# Patient Record
Sex: Male | Born: 1955
Health system: Southern US, Community
[De-identification: ages and names within clinical notes are randomized; demographics above are authoritative.]

## PROBLEM LIST (undated history)

## (undated) DIAGNOSIS — I519 Heart disease, unspecified: Secondary | ICD-10-CM

## (undated) DIAGNOSIS — E785 Hyperlipidemia, unspecified: Secondary | ICD-10-CM

## (undated) DIAGNOSIS — I10 Essential (primary) hypertension: Secondary | ICD-10-CM

## (undated) HISTORY — PX: OTHER SURGICAL HISTORY: SHX169

## (undated) HISTORY — DX: Heart disease, unspecified: I51.9

## (undated) HISTORY — PX: COLONOSCOPY: SHX174

## (undated) HISTORY — DX: Essential (primary) hypertension: I10

## (undated) HISTORY — PX: NO PAST SURGERIES: SHX2092

---
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
## 2008-04-19 ENCOUNTER — Emergency Department (HOSPITAL_BASED_OUTPATIENT_CLINIC_OR_DEPARTMENT_OTHER): Admission: EM | Admit: 2008-04-19 | Discharge: 2008-04-19 | Payer: Self-pay | Admitting: Emergency Medicine

## 2011-03-30 LAB — POCT CARDIAC MARKERS
CKMB, poc: <1 — ABNORMAL LOW
Myoglobin, poc: 63.2
Troponin i, poc: <0.05

## 2011-03-30 LAB — COMPREHENSIVE METABOLIC PANEL
ALT: 19
AST: 22
Albumin: 4.6
Alkaline Phosphatase: 50
BUN: 15
CO2: 27
Calcium: 9.4
Chloride: 101
Creatinine, Ser: 1.1
GFR calc Af Amer: >60
GFR calc non Af Amer: >60
Glucose, Bld: 94
Potassium: 4.1
Sodium: 139
Total Bilirubin: 2 — ABNORMAL HIGH
Total Protein: 7.8

## 2011-03-30 LAB — URINALYSIS, ROUTINE W REFLEX MICROSCOPIC
Bilirubin Urine: NEGATIVE
Glucose, UA: NEGATIVE
Hgb urine dipstick: NEGATIVE
Ketones, ur: NEGATIVE
Nitrite: NEGATIVE
Protein, ur: NEGATIVE
Specific Gravity, Urine: 1.023
Urobilinogen, UA: 1
pH: 6.5

## 2011-03-30 LAB — DIFFERENTIAL
Basophils Absolute: 0.1
Basophils Relative: 1
Eosinophils Absolute: 0.1
Eosinophils Relative: 1
Lymphocytes Relative: 12
Lymphs Abs: 1.5
Monocytes Absolute: 1.6 — ABNORMAL HIGH
Monocytes Relative: 13 — ABNORMAL HIGH
Neutro Abs: 9.2 — ABNORMAL HIGH
Neutrophils Relative %: 74

## 2011-03-30 LAB — CBC
HCT: 48.7
Hemoglobin: 16.7
MCHC: 34.3
MCV: 92.9
Platelets: 202
RBC: 5.24
RDW: 11.5
WBC: 12.5 — ABNORMAL HIGH

## 2011-03-30 LAB — LIPASE, BLOOD: Lipase: 46

## 2017-06-09 ENCOUNTER — Telehealth: Payer: Self-pay | Admitting: *Deleted

## 2017-06-09 NOTE — Telephone Encounter (Signed)
Logan Fox-- who will mail this to pt?  Copied from [place] #[id number]. Topic: Appointment Scheduling - New Patient >> Jun 09, 2017  3:56 PM Antonieta Iba C wrote: New patient has been scheduled for your office.  Provider: Riki Sheer   Date of Appointment: 06/30/17  Route to department's PEC pool.

## 2017-06-10 NOTE — Telephone Encounter (Signed)
Will patient get at the front at check in? Or mail it.?

## 2017-06-30 ENCOUNTER — Ambulatory Visit (INDEPENDENT_AMBULATORY_CARE_PROVIDER_SITE_OTHER): Payer: BLUE CROSS/BLUE SHIELD | Admitting: Family Medicine

## 2017-06-30 ENCOUNTER — Encounter: Payer: Self-pay | Admitting: Family Medicine

## 2017-06-30 VITALS — BP 146/90 | HR 61 | Temp 97.8°F | Ht 74.0 in | Wt 222.2 lb

## 2017-06-30 DIAGNOSIS — I10 Essential (primary) hypertension: Secondary | ICD-10-CM | POA: Diagnosis not present

## 2017-06-30 DIAGNOSIS — I25118 Atherosclerotic heart disease of native coronary artery with other forms of angina pectoris: Secondary | ICD-10-CM | POA: Insufficient documentation

## 2017-06-30 DIAGNOSIS — I2510 Atherosclerotic heart disease of native coronary artery without angina pectoris: Secondary | ICD-10-CM

## 2017-06-30 NOTE — Progress Notes (Signed)
Chief Complaint  Patient presents with  . Establish Care       New Patient Visit SUBJECTIVE: HPI: Logan Fox is an 62 y.o.male who is being seen for establishing care.  The patient was previously seen at [facility].   Hypertension Patient presents for hypertension follow up. He does monitor home blood pressures. Blood pressures ranging on average from 130s/70-80's. He is compliant with medications. Patient has these side effects of medication: none He is adhering to a healthy diet overall. Exercise: cycling, lifting weights  12/2015 had 2 stents placed. On dual antiplatelets.   Knee OA, has been using CBD oil and it is helpful. Has not needed to take Aleve since then.   No Known Allergies  Past Medical History:  Diagnosis Date  . Heart disease   . Hypertension    Past Surgical History:  Procedure Laterality Date  . NO PAST SURGERIES     Social History   Socioeconomic History  . Marital status: Married  Tobacco Use  . Smoking status: Former Research scientist (life sciences)  . Smokeless tobacco: Never Used  Substance and Sexual Activity  . Alcohol use: Yes  . Drug use: No   Family History  Problem Relation Age of Onset  . Arthritis Mother   . Cancer Mother   . Diabetes Mother   . Heart disease Mother   . Hypertension Mother   . Alcohol abuse Father   . Arthritis Father   . Diabetes Father   . Heart disease Father   . Hypertension Father   . Cancer Sister   . Diabetes Sister   . Hypertension Sister   . Kidney disease Sister   . Diabetes Brother   . Hyperlipidemia Brother   . Hypertension Brother     Current Outpatient Medications:  .  aspirin EC 81 MG tablet, Take 81 mg by mouth daily., Disp: , Rfl:  .  carvedilol (COREG) 3.125 MG tablet, Take 3.125 mg by mouth 2 (two) times daily with a meal., Disp: , Rfl:  .  clopidogrel (PLAVIX) 75 MG tablet, Take 75 mg by mouth daily., Disp: , Rfl:  .  lisinopril (PRINIVIL,ZESTRIL) 20 MG tablet, Take 20 mg by mouth daily.,  Disp: , Rfl:   ROS Cardiovascular: Denies chest pain  Respiratory: Denies dyspnea   OBJECTIVE: BP (!) 146/90 (BP Location: Left Arm, Patient Position: Sitting, Cuff Size: Large)   Pulse 61   Temp 97.8 F (36.6 C) (Oral)   Ht 6\' 2"  (1.88 m)   Wt 222 lb 4 oz (100.8 kg)   SpO2 97%   BMI 28.54 kg/m   Constitutional: -  VS reviewed -  Well developed, well nourished, appears stated age -  No apparent distress  Psychiatric: -  Oriented to person, place, and time -  Memory intact -  Affect and mood normal -  Fluent conversation, good eye contact -  Judgment and insight age appropriate  Eye: -  Conjunctivae clear, no discharge -  Pupils symmetric, round, reactive to light  ENMT: -  MMM    Pharynx moist, no exudate, no erythema  Neck: -  No gross swelling, no palpable masses -  Thyroid midline, not enlarged, mobile, no palpable masses  Cardiovascular: -  RRR -  No LE edema  Respiratory: -  Normal respiratory effort, no accessory muscle use, no retraction -  Breath sounds equal, no wheezes, no ronchi, no crackles  Musculoskeletal: -  No clubbing, no cyanosis -  Gait normal  Skin: -  No significant lesion on inspection -  Warm and dry to palpation   ASSESSMENT/PLAN: Essential hypertension  Coronary artery disease involving native heart without angina pectoris, unspecified vessel or lesion type  Patient instructed to sign release of records form from his previous PCP. He has white coat syndrome, not interested in changing medicine at this time given his home readings.  He is going to try to go back on pravastatin with less freq dosing.  Patient should return 6 mo or prn. The patient voiced understanding and agreement to the plan.  [place], DO 06/30/17  8:13 AM

## 2017-06-30 NOTE — Progress Notes (Signed)
Pre visit review using our clinic review tool, if applicable. No additional management support is needed unless otherwise documented below in the visit note. 

## 2017-06-30 NOTE — Patient Instructions (Addendum)
OK to try every 3-4 day dosing of pravastatin. Stay well hydrated and try CoQ10 with it.    Keep checking your BP at home and if it starts to rise, let us know.   Let us know if you need anything.

## 2017-07-27 ENCOUNTER — Other Ambulatory Visit: Payer: Self-pay | Admitting: Family Medicine

## 2017-07-27 MED ORDER — CARVEDILOL 3.125 MG PO TABS: 3.1250 mg | ORAL_TABLET | Freq: Two times a day (BID) | ORAL | 4 refills | Status: DC

## 2017-09-22 ENCOUNTER — Other Ambulatory Visit: Payer: Self-pay | Admitting: Family Medicine

## 2017-09-22 MED ORDER — LISINOPRIL 20 MG PO TABS: 20.0000 mg | ORAL_TABLET | Freq: Every day | ORAL | 1 refills | Status: DC

## 2017-11-28 ENCOUNTER — Telehealth: Payer: Self-pay | Admitting: Family Medicine

## 2017-11-28 MED ORDER — PRAVASTATIN SODIUM 20 MG PO TABS: 20.0000 mg | ORAL_TABLET | Freq: Every day | ORAL | 3 refills | Status: DC

## 2017-11-28 NOTE — Telephone Encounter (Signed)
Reordered. TY.

## 2017-11-28 NOTE — Telephone Encounter (Signed)
Copied from [place] [id number]. Topic: Quick Communication - See Telephone Encounter >> Nov 28, 2017 12:51 PM Vernona Rieger wrote: CRM for notification. See Telephone encounter for: 11/28/17.  Patient said that the pharmacy has contacted Dr Nani Ravens in regards to his " proavastain 20 mg ". It is not on his current medication list and he spoke with Dr Nani Ravens about getting back on it at his appointment with him in January. Please advise  Insurance claims handler [address] [place], [place] - [address]

## 2017-12-17 ENCOUNTER — Other Ambulatory Visit: Payer: Self-pay | Admitting: Family Medicine

## 2017-12-28 ENCOUNTER — Ambulatory Visit (INDEPENDENT_AMBULATORY_CARE_PROVIDER_SITE_OTHER): Payer: BLUE CROSS/BLUE SHIELD | Admitting: Family Medicine

## 2017-12-28 ENCOUNTER — Encounter: Payer: Self-pay | Admitting: Family Medicine

## 2017-12-28 VITALS — BP 122/80 | HR 54 | Temp 98.1°F | Ht 74.0 in | Wt 226.0 lb

## 2017-12-28 DIAGNOSIS — Z114 Encounter for screening for human immunodeficiency virus [HIV]: Secondary | ICD-10-CM

## 2017-12-28 DIAGNOSIS — Z1159 Encounter for screening for other viral diseases: Secondary | ICD-10-CM | POA: Diagnosis not present

## 2017-12-28 DIAGNOSIS — I2510 Atherosclerotic heart disease of native coronary artery without angina pectoris: Secondary | ICD-10-CM | POA: Diagnosis not present

## 2017-12-28 DIAGNOSIS — I10 Essential (primary) hypertension: Secondary | ICD-10-CM | POA: Diagnosis not present

## 2017-12-28 LAB — LIPID PANEL
Cholesterol: 163 mg/dL (ref 0–200)
HDL: 47.9 mg/dL (ref >39.00)
LDL Cholesterol: 98 mg/dL (ref 0–99)
NonHDL: 114.71
Total CHOL/HDL Ratio: 3
Triglycerides: 83 mg/dL (ref 0.0–149.0)
VLDL: 16.6 mg/dL (ref 0.0–40.0)

## 2017-12-28 LAB — COMPREHENSIVE METABOLIC PANEL
ALT: 21 U/L (ref 0–53)
AST: 16 U/L (ref 0–37)
Albumin: 4.5 g/dL (ref 3.5–5.2)
Alkaline Phosphatase: 54 U/L (ref 39–117)
BUN: 17 mg/dL (ref 6–23)
CO2: 25 mEq/L (ref 19–32)
Calcium: 9.3 mg/dL (ref 8.4–10.5)
Chloride: 105 mEq/L (ref 96–112)
Creatinine, Ser: 0.95 mg/dL (ref 0.40–1.50)
GFR: 85.24 mL/min (ref >60.00)
Glucose, Bld: 108 mg/dL — ABNORMAL HIGH (ref 70–99)
Potassium: 4.4 mEq/L (ref 3.5–5.1)
Sodium: 138 mEq/L (ref 135–145)
Total Bilirubin: 0.8 mg/dL (ref 0.2–1.2)
Total Protein: 6.7 g/dL (ref 6.0–8.3)

## 2017-12-28 NOTE — Progress Notes (Signed)
Pre visit review using our clinic review tool, if applicable. No additional management support is needed unless otherwise documented below in the visit note. 

## 2017-12-28 NOTE — Progress Notes (Signed)
Chief Complaint  Patient presents with  . Follow-up    Subjective Logan Fox is a 62 y.o. male who presents for hypertension follow up. He does monitor home blood pressures. Blood pressures ranging from 120's/70-80's on average. He is compliant with medications. Patient has these side effects of medication: none He is adhering to a healthy diet overall. Current exercise: cycling, walking, elliptical  Hx of CAD Started on Pravachol and CoQ10 as he has had joint/muscle issues w Zocor, Lipitor and Zocor. No myalgias. Is active, diet healthy overall.    Past Medical History:  Diagnosis Date  . Heart disease   . Hypertension     Review of Systems Cardiovascular: no chest pain Respiratory:  no shortness of breath  Exam BP 122/80 (BP Location: Left Arm, Patient Position: Sitting, Cuff Size: Normal)   Pulse (!) 54   Temp 98.1 F (36.7 C) (Oral)   Ht 6\' 2"  (1.88 m)   Wt 226 lb (102.5 kg)   SpO2 93%   BMI 29.02 kg/m  General:  well developed, well nourished, in no apparent distress Heart: RRR, no bruits, no LE edema Lungs: clear to auscultation, no accessory muscle use Psych: well oriented with normal range of affect and appropriate judgment/insight  Essential hypertension - Plan: Comprehensive metabolic panel  Coronary artery disease involving native heart without angina pectoris, unspecified vessel or lesion type - Plan: Lipid panel  Screening for HIV (human immunodeficiency virus) - Plan: HIV antibody  Encounter for hepatitis C screening test for low risk patient - Plan: Hepatitis C antibody  Orders as above. Counseled on diet and exercise. F/u in 6 mo for CPE. The patient voiced understanding and agreement to the plan.  [place], DO 12/28/17  8:36 AM

## 2017-12-28 NOTE — Patient Instructions (Addendum)
Keep up the good work.   Ok to check your blood pressure whenever you want.   Keep the diet clean. Stay physically active.  Let us know if you need anything.

## 2017-12-29 LAB — HIV ANTIBODY (ROUTINE TESTING W REFLEX): HIV 1&2 Ab, 4th Generation: NONREACTIVE

## 2017-12-29 LAB — HEPATITIS C ANTIBODY
Hepatitis C Ab: NONREACTIVE
SIGNAL TO CUT-OFF: 0.02 (ref <1.00)

## 2018-03-13 ENCOUNTER — Telehealth: Payer: Self-pay

## 2018-03-13 ENCOUNTER — Ambulatory Visit (HOSPITAL_BASED_OUTPATIENT_CLINIC_OR_DEPARTMENT_OTHER)
Admission: RE | Admit: 2018-03-13 | Discharge: 2018-03-13 | Disposition: A | Discharge status: Home / Self Care | Payer: BLUE CROSS/BLUE SHIELD | Source: Ambulatory Visit | Attending: Family | Admitting: Family

## 2018-03-13 ENCOUNTER — Encounter: Payer: Self-pay | Admitting: Family

## 2018-03-13 ENCOUNTER — Ambulatory Visit (INDEPENDENT_AMBULATORY_CARE_PROVIDER_SITE_OTHER): Payer: BLUE CROSS/BLUE SHIELD | Admitting: Family

## 2018-03-13 VITALS — BP 130/84 | HR 56 | Temp 98.3°F | Resp 16 | Ht 74.0 in | Wt 225.6 lb

## 2018-03-13 DIAGNOSIS — M79674 Pain in right toe(s): Secondary | ICD-10-CM

## 2018-03-13 DIAGNOSIS — M25561 Pain in right knee: Secondary | ICD-10-CM

## 2018-03-13 DIAGNOSIS — M85671 Other cyst of bone, right ankle and foot: Secondary | ICD-10-CM | POA: Diagnosis not present

## 2018-03-13 MED ORDER — COLCHICINE 0.6 MG PO TABS: ORAL_TABLET | ORAL | 0 refills | Status: DC

## 2018-03-13 MED ORDER — COLCHICINE 0.6 MG PO CAPS: ORAL_CAPSULE | ORAL | 1 refills | Status: DC

## 2018-03-13 NOTE — Progress Notes (Signed)
Subjective:    Patient ID: Logan Fox, male    DOB: Apr 02, 1956, 62 y.o.   MRN: [id number]  HPI  Patient is a 62 yr old male who presents today with chief complaint of pain in the right great toe.  He reports the pain is been present for approximately 1 week.  He has previous history of gout.  He reports that he hyperextended his right great toe 1 week ago.  He has had pain ever since.  He does feel however that his symptoms are similar to his previous gout flares.  He also reports that he has some chronic right-sided knee pain.  He reports minimal improvement with up with over-the-counter Aleve.  He tries to limit his Aleve due to history of cardiovascular disease.  Review of Systems See HPI  Past Medical History:  Diagnosis Date  . Heart disease   . Hypertension      Social History   Socioeconomic History  . Marital status: Married    Spouse name: Not on file  . Number of children: Not on file  . Years of education: Not on file  . Highest education level: Not on file  Occupational History  . Not on file  Social Needs  . Financial resource strain: Not on file  . Food insecurity:    Worry: Not on file    Inability: Not on file  . Transportation needs:    Medical: Not on file    Non-medical: Not on file  Tobacco Use  . Smoking status: Former Smoker    Start date: 1973    Last attempt to quit: 1989    Years since quitting: 30.7  . Smokeless tobacco: Never Used  Substance and Sexual Activity  . Alcohol use: Yes  . Drug use: No  . Sexual activity: Yes    Partners: Female  Lifestyle  . Physical activity:    Days per week: Not on file    Minutes per session: Not on file  . Stress: Not on file  Relationships  . Social connections:    Talks on phone: Not on file    Gets together: Not on file    Attends religious service: Not on file    Active member of club or organization: Not on file    Attends meetings of clubs or organizations: Not on file    Relationship  status: Not on file  . Intimate partner violence:    Fear of current or ex partner: Not on file    Emotionally abused: Not on file    Physically abused: Not on file    Forced sexual activity: Not on file  Other Topics Concern  . Not on file  Social History Narrative  . Not on file    Past Surgical History:  Procedure Laterality Date  . NO PAST SURGERIES      Family History  Problem Relation Age of Onset  . Arthritis Mother   . Cancer Mother   . Diabetes Mother   . Heart disease Mother   . Hypertension Mother   . Alcohol abuse Father   . Arthritis Father   . Diabetes Father   . Heart disease Father   . Hypertension Father   . Cancer Sister   . Diabetes Sister   . Hypertension Sister   . Kidney disease Sister   . Diabetes Brother   . Hyperlipidemia Brother   . Hypertension Brother     No Known Allergies  Current Outpatient  Medications on File Prior to Visit  Medication Sig Dispense Refill  . aspirin EC 81 MG tablet Take 81 mg by mouth daily.    . carvedilol (COREG) 3.125 MG tablet TAKE 1 TABLET BY MOUTH TWICE DAILY WITH MEALS 60 tablet 4  . clopidogrel (PLAVIX) 75 MG tablet Take 75 mg by mouth daily.    Marland Kitchen lisinopril (PRINIVIL,ZESTRIL) 20 MG tablet Take 1 tablet (20 mg total) by mouth daily. 90 tablet 1  . pravastatin (PRAVACHOL) 20 MG tablet Take 1 tablet (20 mg total) by mouth daily. 90 tablet 3   No current facility-administered medications on file prior to visit.     BP 130/84 (BP Location: Left Arm, Cuff Size: Normal)   Pulse (!) 56   Temp 98.3 F (36.8 C) (Oral)   Resp 16   Ht 6\' 2"  (1.88 m)   Wt 225 lb 9.6 oz (102.3 kg)   SpO2 97%   BMI 28.97 kg/m       Objective:   Physical Exam  Constitutional: He is oriented to person, place, and time. He appears well-developed and well-nourished. No distress.  HENT:  Head: Normocephalic and atraumatic.  Cardiovascular: Normal rate and regular rhythm.  No murmur heard. Pulmonary/Chest: Effort normal and  breath sounds normal. No respiratory distress. He has no wheezes. He has no rales.  Musculoskeletal:  Mild swelling of the right knee  Mild erythema at the base of the right great toe  Neurological: He is alert and oriented to person, place, and time.  Skin: Skin is warm and dry.  Psychiatric: He has a normal mood and affect. His behavior is normal. Thought content normal.          Assessment & Plan:  Gout-prescription provided for colchicine.  He requests a refill which we will call into his pharmacy.  We discussed possibility of adding a preventive medication for gout on a daily basis however he declines since he reports that he typically only has flareups about twice a year.  Since he did have some trauma to the base of that toe about a week ago and has been having pain since then we will obtain an x-ray of the great toe.  Right knee pain- will refer to sports medicine for further evaluation.

## 2018-03-13 NOTE — Telephone Encounter (Signed)
Generic colchicine not covered, preferred brand name Mitigare.

## 2018-03-13 NOTE — Patient Instructions (Signed)
Start colchicine. Call if symptoms worsen or if they do not improve. Complete toe x-ray on the first floor. You should be contacted about scheduling your referral to sports medicine.

## 2018-03-13 NOTE — Telephone Encounter (Signed)
Completed. Refill added to rx as well at pt request. Gilmore Laroche, please disregard my earlier staff message.

## 2018-03-19 ENCOUNTER — Other Ambulatory Visit: Payer: Self-pay | Admitting: Family Medicine

## 2018-03-19 DIAGNOSIS — I10 Essential (primary) hypertension: Secondary | ICD-10-CM

## 2018-03-21 ENCOUNTER — Encounter: Payer: Self-pay | Admitting: Family Medicine

## 2018-03-21 ENCOUNTER — Ambulatory Visit (INDEPENDENT_AMBULATORY_CARE_PROVIDER_SITE_OTHER): Payer: BLUE CROSS/BLUE SHIELD | Admitting: Family Medicine

## 2018-03-21 VITALS — BP 160/92 | HR 63 | Ht 74.0 in | Wt 225.0 lb

## 2018-03-21 DIAGNOSIS — M25561 Pain in right knee: Secondary | ICD-10-CM

## 2018-03-21 DIAGNOSIS — G8929 Other chronic pain: Secondary | ICD-10-CM

## 2018-03-21 NOTE — Progress Notes (Signed)
PCP: Shelda Pal, DO Consultation requested by Debbrah Alar NP  Subjective:   HPI: Patient is a 62 y.o. male here for right knee pain.  Patient reports he's had about 8-10 months of off and on pain in right knee. Pain is achy and a stiffness. Thinks he aggravated this remotely when he stopped quickly on his bike and turned right knee some. Feels like he gets cramps into anterior, posterior leg. Pain with bending. Feels pretty good today though. Some associated swelling. No skin changes, numbness.  Past Medical History:  Diagnosis Date  . Heart disease   . Hypertension     Current Outpatient Medications on File Prior to Visit  Medication Sig Dispense Refill  . aspirin EC 81 MG tablet Take 81 mg by mouth daily.    . carvedilol (COREG) 3.125 MG tablet TAKE 1 TABLET BY MOUTH TWICE DAILY WITH MEALS 60 tablet 4  . clopidogrel (PLAVIX) 75 MG tablet Take 75 mg by mouth daily.    . Colchicine (MITIGARE) 0.6 MG CAPS 2 caps now, 1 cap 1 hour later.  If symptoms persist, you may repeat the following day. 6 capsule 1  . lisinopril (PRINIVIL,ZESTRIL) 20 MG tablet TAKE 1 TABLET BY MOUTH ONCE DAILY 90 tablet 1  . pravastatin (PRAVACHOL) 20 MG tablet Take 1 tablet (20 mg total) by mouth daily. 90 tablet 3   No current facility-administered medications on file prior to visit.     Past Surgical History:  Procedure Laterality Date  . NO PAST SURGERIES      No Known Allergies  Social History   Socioeconomic History  . Marital status: Married    Spouse name: Not on file  . Number of children: Not on file  . Years of education: Not on file  . Highest education level: Not on file  Occupational History  . Not on file  Social Needs  . Financial resource strain: Not on file  . Food insecurity:    Worry: Not on file    Inability: Not on file  . Transportation needs:    Medical: Not on file    Non-medical: Not on file  Tobacco Use  . Smoking status: Former Smoker   Start date: 1973    Last attempt to quit: 1989    Years since quitting: 30.7  . Smokeless tobacco: Never Used  Substance and Sexual Activity  . Alcohol use: Yes  . Drug use: No  . Sexual activity: Yes    Partners: Female  Lifestyle  . Physical activity:    Days per week: Not on file    Minutes per session: Not on file  . Stress: Not on file  Relationships  . Social connections:    Talks on phone: Not on file    Gets together: Not on file    Attends religious service: Not on file    Active member of club or organization: Not on file    Attends meetings of clubs or organizations: Not on file    Relationship status: Not on file  . Intimate partner violence:    Fear of current or ex partner: Not on file    Emotionally abused: Not on file    Physically abused: Not on file    Forced sexual activity: Not on file  Other Topics Concern  . Not on file  Social History Narrative  . Not on file    Family History  Problem Relation Age of Onset  . Arthritis Mother   .  Cancer Mother   . Diabetes Mother   . Heart disease Mother   . Hypertension Mother   . Alcohol abuse Father   . Arthritis Father   . Diabetes Father   . Heart disease Father   . Hypertension Father   . Cancer Sister   . Diabetes Sister   . Hypertension Sister   . Kidney disease Sister   . Diabetes Brother   . Hyperlipidemia Brother   . Hypertension Brother     BP (!) 160/92   Pulse 63   Ht 6\' 2"  (1.88 m)   Wt 225 lb (102.1 kg)   BMI 28.89 kg/m   Review of Systems: See HPI above.     Objective:  Physical Exam:  Gen: NAD, comfortable in exam room  Right knee: No gross deformity, ecchymoses.  Mild effusion. No TTP currently. FROM with 5/5 strength flexion and extension. Negative ant/post drawers. Negative valgus/varus testing. Negative lachmans. Negative mcmurrays, apleys, patellar apprehension, thessalys. NV intact distally.  Left knee: No deformity. FROM with 5/5 strength. No tenderness to  palpation. NVI distally.   MSK u/s:  Mild effusion right knee.  Minimal baker's cyst.  Mild arthropathy noted medial compartment.  Assessment & Plan:  1. Right knee pain - consistent with arthritis causing effusion.  Discussed tylenol, topical medications, supplements that may help, aleve.  Knee sleeve for support should help a lot as well.  Shown home exercises to do daily.  Icing, elevation.  F/u in 1 month or as needed.

## 2018-03-21 NOTE — Patient Instructions (Signed)
Your pain is due to arthritis causing swelling in your knee. These are the different medications you can take for this: Tylenol 500mg  1-2 tabs three times a day for pain. Capsaicin, aspercreme, or biofreeze topically up to four times a day may also help with pain. Some supplements that may help for arthritis: Boswellia extract, curcumin, pycnogenol Aleve 1-2 tabs twice a day with food as needed. Knee sleeve (bodyhelix) should help you a lot when up and walking around. Cortisone injections are an option. If cortisone injections do not help, there are different types of shots that may help but they take longer to take effect. It's important that you continue to stay active. Straight leg raises, knee extensions 3 sets of 10 once a day (add ankle weight if these become too easy). Consider physical therapy to strengthen muscles around the joint that hurts to take pressure off of the joint itself. Shoe inserts with good arch support may be helpful. Ice 15 minutes at a time 3-4 times a day as needed to help with pain. Water aerobics and cycling with low resistance are the best two types of exercise for arthritis though any exercise is ok as long as it doesn't worsen the pain. Follow up with me in 1 month or as needed.

## 2018-04-21 ENCOUNTER — Ambulatory Visit: Payer: BLUE CROSS/BLUE SHIELD | Admitting: Family Medicine

## 2018-05-05 ENCOUNTER — Telehealth: Payer: Self-pay | Admitting: Family Medicine

## 2018-05-05 DIAGNOSIS — I2510 Atherosclerotic heart disease of native coronary artery without angina pectoris: Secondary | ICD-10-CM

## 2018-05-05 NOTE — Telephone Encounter (Signed)
Copied from [place] [id number]. Topic: Referral - Request for Referral >> May 05, 2018  9:09 AM Sheran Luz wrote: Has patient seen PCP for this complaint? No *If NO, is insurance requiring patient see PCP for this issue before PCP can refer them? Patient does not know.   Referral for which specialty: Cardiology  Preferred provider/office: [facility]  Reason for referral: Had two stints put in three years ago-Now seeing [facility] Cardiologist and is unhappy.

## 2018-05-05 NOTE — Telephone Encounter (Signed)
OK 

## 2018-05-21 ENCOUNTER — Other Ambulatory Visit: Payer: Self-pay | Admitting: Family Medicine

## 2018-06-12 ENCOUNTER — Ambulatory Visit (INDEPENDENT_AMBULATORY_CARE_PROVIDER_SITE_OTHER): Payer: BLUE CROSS/BLUE SHIELD | Admitting: Family Medicine

## 2018-06-12 ENCOUNTER — Encounter: Payer: Self-pay | Admitting: Family Medicine

## 2018-06-12 ENCOUNTER — Ambulatory Visit (HOSPITAL_BASED_OUTPATIENT_CLINIC_OR_DEPARTMENT_OTHER)
Admission: RE | Admit: 2018-06-12 | Discharge: 2018-06-12 | Disposition: A | Discharge status: Home / Self Care | Payer: BLUE CROSS/BLUE SHIELD | Source: Ambulatory Visit | Attending: Family Medicine | Admitting: Family Medicine

## 2018-06-12 VITALS — BP 130/82 | HR 57 | Ht 74.0 in | Wt 227.0 lb

## 2018-06-12 DIAGNOSIS — M25561 Pain in right knee: Secondary | ICD-10-CM

## 2018-06-12 DIAGNOSIS — G8929 Other chronic pain: Secondary | ICD-10-CM | POA: Insufficient documentation

## 2018-06-12 NOTE — Patient Instructions (Signed)
We will go ahead with an MRI of your knee to assess for a loose body and/or a meniscus tear that's causing your block in extension and flexion. I will call you with the results and next steps. Aleve 1-2 tabs twice a day with food as needed. Knee sleeve (bodyhelix) should help you a lot when up and walking around.

## 2018-06-12 NOTE — Progress Notes (Signed)
PCP: Shelda Pal, DO Consultation requested by Debbrah Alar NP  Subjective:   HPI: Patient is a 62 y.o. male here for right knee pain.  9/24: Patient reports he's had about 8-10 months of off and on pain in right knee. Pain is achy and a stiffness. Thinks he aggravated this remotely when he stopped quickly on his bike and turned right knee some. Feels like he gets cramps into anterior, posterior leg. Pain with bending. Feels pretty good today though. Some associated swelling. No skin changes, numbness.  12/16: Patient reports improvement in his pain.  Currently 1/10 pain, a soreness posterior knee.  He does take Aleve about daily to help with his pain.  Otherwise, he is able to walk and even jog fairly comfortably.  His biggest complaint at this time is issues with achieving full extension or flexion the knee.  His pain is worse when trying to flex his knee.  He does note some mild persistent swelling but overall this has greatly improved.  No numbness or tingling.  No erythema or bruising.  No skin changes.  He wears a compression knee sleeve which he finds very helpful.  Past Medical History:  Diagnosis Date  . Heart disease   . Hypertension     Current Outpatient Medications on File Prior to Visit  Medication Sig Dispense Refill  . aspirin EC 81 MG tablet Take 81 mg by mouth daily.    . carvedilol (COREG) 3.125 MG tablet TAKE 1 TABLET BY MOUTH TWICE DAILY WITH MEALS 60 tablet 4  . clopidogrel (PLAVIX) 75 MG tablet Take 75 mg by mouth daily.    . Colchicine (MITIGARE) 0.6 MG CAPS 2 caps now, 1 cap 1 hour later.  If symptoms persist, you may repeat the following day. 6 capsule 1  . lisinopril (PRINIVIL,ZESTRIL) 20 MG tablet TAKE 1 TABLET BY MOUTH ONCE DAILY 90 tablet 1  . pravastatin (PRAVACHOL) 20 MG tablet Take 1 tablet (20 mg total) by mouth daily. 90 tablet 3   No current facility-administered medications on file prior to visit.     Past Surgical History:   Procedure Laterality Date  . NO PAST SURGERIES      No Known Allergies  Social History   Socioeconomic History  . Marital status: Married    Spouse name: Not on file  . Number of children: Not on file  . Years of education: Not on file  . Highest education level: Not on file  Occupational History  . Not on file  Social Needs  . Financial resource strain: Not on file  . Food insecurity:    Worry: Not on file    Inability: Not on file  . Transportation needs:    Medical: Not on file    Non-medical: Not on file  Tobacco Use  . Smoking status: Former Smoker    Start date: 1973    Last attempt to quit: 1989    Years since quitting: 30.9  . Smokeless tobacco: Never Used  Substance and Sexual Activity  . Alcohol use: Yes  . Drug use: No  . Sexual activity: Yes    Partners: Female  Lifestyle  . Physical activity:    Days per week: Not on file    Minutes per session: Not on file  . Stress: Not on file  Relationships  . Social connections:    Talks on phone: Not on file    Gets together: Not on file    Attends religious service: Not  on file    Active member of club or organization: Not on file    Attends meetings of clubs or organizations: Not on file    Relationship status: Not on file  . Intimate partner violence:    Fear of current or ex partner: Not on file    Emotionally abused: Not on file    Physically abused: Not on file    Forced sexual activity: Not on file  Other Topics Concern  . Not on file  Social History Narrative  . Not on file    Family History  Problem Relation Age of Onset  . Arthritis Mother   . Cancer Mother   . Diabetes Mother   . Heart disease Mother   . Hypertension Mother   . Alcohol abuse Father   . Arthritis Father   . Diabetes Father   . Heart disease Father   . Hypertension Father   . Cancer Sister   . Diabetes Sister   . Hypertension Sister   . Kidney disease Sister   . Diabetes Brother   . Hyperlipidemia Brother   .  Hypertension Brother     BP 130/82   Pulse (!) 57   Ht 6\' 2"  (1.88 m)   Wt 227 lb (103 kg)   BMI 29.15 kg/m   Review of Systems: See HPI above.     Objective:  Physical Exam:  GEN: awake, alert, NAD Pulm: breathing unlabored  Right knee: - Inspection: no gross deformity.  Mild swelling.  No erythema or bruising. Skin intact - Palpation: no TTP - ROM: Patient is limited to about 90 degrees of knee flexion both actively and passively.  Posterior knee pain with flexion.  Slight decreased extension compared to the left as well. - Strength: 5/5 strength - Neuro/vasc: NV intact - Special Tests: - LIGAMENTS: negative anterior and posterior drawer, negative Lachman's, no MCL or LCL laxity  -- MENISCUS: negative McMurray's -- PF JOINT: nml patellar mobility without apprehension  MSK [place]: limited US right knee - mild effusion. Slight extrusion of the medial meniscus. Calcific changes visualized within the posterior medial meniscus without obvious tearing. No bakers cyst.  Left knee: No swelling No tenderness Full range of motion of flexion-extension with 5/5 strength N/V intact  Normal hip range of motion with IR/ER without pain  Assessment & Plan:  1. Right knee pain-patient does have some mild arthritis, moderate in patellofemoral compartment.  Overall his pain is improved, however his range of motion limitations are concerning for possible meniscal pathology vs loose body.  - Continue Aleve as needed - MRI of the right knee - continue home strengthening exercises - Follow-up after MRI results

## 2018-07-02 ENCOUNTER — Ambulatory Visit
Admission: RE | Admit: 2018-07-02 | Discharge: 2018-07-02 | Disposition: A | Discharge status: Home / Self Care | Payer: BLUE CROSS/BLUE SHIELD | Source: Ambulatory Visit | Attending: Family Medicine | Admitting: Family Medicine

## 2018-07-02 ENCOUNTER — Other Ambulatory Visit: Payer: BLUE CROSS/BLUE SHIELD

## 2018-07-02 DIAGNOSIS — G8929 Other chronic pain: Secondary | ICD-10-CM

## 2018-07-02 DIAGNOSIS — M25561 Pain in right knee: Principal | ICD-10-CM

## 2018-07-04 NOTE — Addendum Note (Signed)
Addended by: Sherrie George F on: 07/04/2018 04:57 PM   Modules accepted: Orders

## 2018-07-12 ENCOUNTER — Telehealth: Payer: Self-pay | Admitting: *Deleted

## 2018-07-12 NOTE — Telephone Encounter (Signed)
Received Medical/Surgical Clearance Form from [facility]; forwarded to provider/SLS 01/15

## 2018-07-13 ENCOUNTER — Encounter: Payer: BLUE CROSS/BLUE SHIELD | Admitting: Family Medicine

## 2018-08-11 ENCOUNTER — Ambulatory Visit (INDEPENDENT_AMBULATORY_CARE_PROVIDER_SITE_OTHER): Payer: BLUE CROSS/BLUE SHIELD | Admitting: Family Medicine

## 2018-08-11 ENCOUNTER — Encounter: Payer: Self-pay | Admitting: Family Medicine

## 2018-08-11 VITALS — BP 134/84 | HR 60 | Temp 98.1°F | Ht 74.0 in | Wt 226.4 lb

## 2018-08-11 DIAGNOSIS — I2510 Atherosclerotic heart disease of native coronary artery without angina pectoris: Secondary | ICD-10-CM

## 2018-08-11 DIAGNOSIS — M1A9XX0 Chronic gout, unspecified, without tophus (tophi): Secondary | ICD-10-CM

## 2018-08-11 DIAGNOSIS — I10 Essential (primary) hypertension: Secondary | ICD-10-CM

## 2018-08-11 MED ORDER — COLCHICINE 0.6 MG PO CAPS: ORAL_CAPSULE | ORAL | 1 refills | Status: DC

## 2018-08-11 NOTE — Progress Notes (Signed)
Chief Complaint  Patient presents with  . Follow-up    Subjective Logan Fox is a 63 y.o. male who presents for hypertension follow up. He does monitor home blood pressures. Blood pressures ranging from 120-130's/70-80's on average. He is compliant with medications- lisinopril 20 mg, Coreg 3.125 mg bid. Patient has these side effects of medication: none He is usually adhering to a healthy diet overall. Current exercise: none currently due to recent scope  History of coronary artery disease.  He was doing well on the follow-up.  That being said, his insurance stopped covering this medicine so now he is on Crestor 5 mg 3 days a week.  He has not been very active due to her recent knee surgery and his diet is fair overall.  History of gout, no recent flares.  Takes colchicine for flares and would like a refill.   Past Medical History:  Diagnosis Date  . Heart disease   . Hypertension     Review of Systems Cardiovascular: no chest pain Respiratory:  no shortness of breath  Exam BP 134/84 (BP Location: Left Arm, Patient Position: Sitting)   Pulse 60   Temp 98.1 F (36.7 C) (Oral)   Ht 6\' 2"  (1.88 m)   Wt 226 lb 6 oz (102.7 kg)   SpO2 96%   BMI 29.06 kg/m  General:  well developed, well nourished, in no apparent distress Heart: RRR, no bruits, no LE edema Lungs: clear to auscultation, no accessory muscle use Psych: well oriented with normal range of affect and appropriate judgment/insight  Essential hypertension  Coronary artery disease involving native heart without angina pectoris, unspecified vessel or lesion type - Plan: rosuvastatin (CRESTOR) 5 MG tablet  Chronic gout without tophus, unspecified cause, unspecified site - Plan: Colchicine (MITIGARE) 0.6 MG CAPS  Cont medications. Counseled on diet and exercise. OK to stop checking home BP's if he wants to. F/u in 6 mo for CPE. The patient voiced understanding and agreement to the plan.  [place],  DO 08/11/18  8:55 AM

## 2018-08-11 NOTE — Patient Instructions (Addendum)
Keep the diet clean and stay active.  Because your blood pressure is well-controlled, you no longer have to check your blood pressure at home anymore unless you wish. Some people check it twice daily every day and some people stop altogether. Either or anything in between is fine. Strong work!  Let us know if you need anything. 

## 2018-09-17 ENCOUNTER — Other Ambulatory Visit: Payer: Self-pay | Admitting: Family Medicine

## 2018-09-17 DIAGNOSIS — I10 Essential (primary) hypertension: Secondary | ICD-10-CM

## 2018-10-23 ENCOUNTER — Other Ambulatory Visit: Payer: Self-pay | Admitting: Family Medicine

## 2018-11-07 ENCOUNTER — Telehealth: Payer: Self-pay | Admitting: Family Medicine

## 2018-11-07 DIAGNOSIS — I2510 Atherosclerotic heart disease of native coronary artery without angina pectoris: Secondary | ICD-10-CM

## 2018-11-07 MED ORDER — ROSUVASTATIN CALCIUM 5 MG PO TABS
5.0000 mg | ORAL_TABLET | Freq: Every day | ORAL | 3 refills | Status: DC
Start: 2018-11-07 — End: 2019-02-09

## 2018-11-07 NOTE — Telephone Encounter (Signed)
done

## 2018-11-07 NOTE — Telephone Encounter (Signed)
OK to do. Ty.   

## 2018-11-07 NOTE — Telephone Encounter (Signed)
Pharmacy fax patient wanting to go back to taking Crestor 5 mg daily (currently was taking three times per week) If ok let me know and can send in as patient requested.

## 2018-11-20 ENCOUNTER — Other Ambulatory Visit: Payer: Self-pay | Admitting: Family Medicine

## 2018-12-17 ENCOUNTER — Other Ambulatory Visit: Payer: Self-pay | Admitting: Family Medicine

## 2018-12-17 DIAGNOSIS — I10 Essential (primary) hypertension: Secondary | ICD-10-CM

## 2019-01-28 ENCOUNTER — Other Ambulatory Visit: Payer: Self-pay | Admitting: Family Medicine

## 2019-02-09 ENCOUNTER — Ambulatory Visit (INDEPENDENT_AMBULATORY_CARE_PROVIDER_SITE_OTHER): Payer: BC Managed Care – PPO | Admitting: Family Medicine

## 2019-02-09 ENCOUNTER — Other Ambulatory Visit: Payer: Self-pay

## 2019-02-09 ENCOUNTER — Encounter: Payer: Self-pay | Admitting: Family Medicine

## 2019-02-09 VITALS — BP 108/68 | HR 58 | Temp 98.3°F | Ht 75.0 in | Wt 224.0 lb

## 2019-02-09 DIAGNOSIS — Z0000 Encounter for general adult medical examination without abnormal findings: Secondary | ICD-10-CM

## 2019-02-09 DIAGNOSIS — I10 Essential (primary) hypertension: Secondary | ICD-10-CM | POA: Diagnosis not present

## 2019-02-09 DIAGNOSIS — I2510 Atherosclerotic heart disease of native coronary artery without angina pectoris: Secondary | ICD-10-CM

## 2019-02-09 LAB — LIPID PANEL
Cholesterol: 156 mg/dL (ref 0–200)
HDL: 42.8 mg/dL (ref >39.00)
LDL Cholesterol: 94 mg/dL (ref 0–99)
NonHDL: 113.6
Total CHOL/HDL Ratio: 4
Triglycerides: 96 mg/dL (ref 0.0–149.0)
VLDL: 19.2 mg/dL (ref 0.0–40.0)

## 2019-02-09 LAB — CBC
HCT: 45.9 % (ref 39.0–52.0)
Hemoglobin: 16.1 g/dL (ref 13.0–17.0)
MCHC: 35.2 g/dL (ref 30.0–36.0)
MCV: 95 fl (ref 78.0–100.0)
Platelets: 193 10*3/uL (ref 150.0–400.0)
RBC: 4.83 Mil/uL (ref 4.22–5.81)
RDW: 12.2 % (ref 11.5–15.5)
WBC: 5.8 10*3/uL (ref 4.0–10.5)

## 2019-02-09 LAB — COMPREHENSIVE METABOLIC PANEL
ALT: 23 U/L (ref 0–53)
AST: 18 U/L (ref 0–37)
Albumin: 4.6 g/dL (ref 3.5–5.2)
Alkaline Phosphatase: 54 U/L (ref 39–117)
BUN: 17 mg/dL (ref 6–23)
CO2: 24 mEq/L (ref 19–32)
Calcium: 9.4 mg/dL (ref 8.4–10.5)
Chloride: 103 mEq/L (ref 96–112)
Creatinine, Ser: 0.91 mg/dL (ref 0.40–1.50)
GFR: 83.98 mL/min (ref >60.00)
Glucose, Bld: 108 mg/dL — ABNORMAL HIGH (ref 70–99)
Potassium: 4.2 mEq/L (ref 3.5–5.1)
Sodium: 136 mEq/L (ref 135–145)
Total Bilirubin: 1.1 mg/dL (ref 0.2–1.2)
Total Protein: 6.7 g/dL (ref 6.0–8.3)

## 2019-02-09 MED ORDER — ROSUVASTATIN CALCIUM 5 MG PO TABS: 5.0000 mg | ORAL_TABLET | Freq: Every day | ORAL | 2 refills | Status: DC

## 2019-02-09 MED ORDER — LISINOPRIL 20 MG PO TABS: 20.0000 mg | ORAL_TABLET | Freq: Every day | ORAL | 2 refills | Status: DC

## 2019-02-09 NOTE — Patient Instructions (Addendum)
The new Shingrix vaccine (for shingles) is a 2 shot series. It can make people feel low energy, achy and almost like they have the flu for 48 hours after injection. Please plan accordingly when deciding on when to get this shot. Call our office for a nurse visit appointment to get this. The second shot of the series is less severe regarding the side effects, but it still lasts 48 hours.   Give [place] 2-3 business days to get the results of your labs back.   Keep the diet clean and stay active.   I suggest getting flu shots in mid October.   Let us know if you need anything.

## 2019-02-09 NOTE — Progress Notes (Signed)
Chief Complaint  Patient presents with  . Follow-up    [name] is here for a complete physical.   His last physical was >1 year ago.  Current diet: in general, diet OK overall.  Current exercise: elliptical  Weight trend: stable aytime fatigue? No. Seat belt? Yes.    Health maintenance Shingrix- No Colonoscopy- Yes Tetanus- Yes HIV- Yes Hep C- Yes   Past Medical History:  Diagnosis Date  . Heart disease   . Hypertension       Past Surgical History:  Procedure Laterality Date  . NO PAST SURGERIES      Medications  Current Outpatient Medications on File Prior to Visit  Medication Sig Dispense Refill  . aspirin EC 81 MG tablet Take 81 mg by mouth daily.    . carvedilol (COREG) 3.125 MG tablet TAKE 1 TABLET BY MOUTH TWICE DAILY WITH MEALS 60 tablet 0  . Colchicine (MITIGARE) 0.6 MG CAPS 2 caps now, 1 cap 1 hour later.  If symptoms persist, you may repeat the following day. 30 capsule 1  . lisinopril (ZESTRIL) 20 MG tablet Take 1 tablet by mouth once daily 90 tablet 0  . rosuvastatin (CRESTOR) 5 MG tablet Take 1 tablet (5 mg total) by mouth daily. 30 tablet 3   Allergies No Known Allergies  Family History Family History  Problem Relation Age of Onset  . Arthritis Mother   . Cancer Mother   . Diabetes Mother   . Heart disease Mother   . Hypertension Mother   . Alcohol abuse Father   . Arthritis Father   . Diabetes Father   . Heart disease Father   . Hypertension Father   . Cancer Sister   . Diabetes Sister   . Hypertension Sister   . Kidney disease Sister   . Diabetes Brother   . Hyperlipidemia Brother   . Hypertension Brother     Review of Systems: Constitutional:  no fevers Eye:  no recent significant change in vision Ear/Nose/Mouth/Throat:  Ears:  no hearing loss Nose/Mouth/Throat:  no complaints of nasal congestion, no sore throat Cardiovascular:  no chest pain, no palpitations Respiratory:  no cough and no shortness of  breath Gastrointestinal:  no abdominal pain, no change in bowel habits GU:  Male: negative for dysuria, frequency, and incontinence and negative for prostate symptoms Musculoskeletal/Extremities:  no pain, redness, or swelling of the joints Integumentary (Skin/Breast):  no abnormal skin lesions reported Neurologic:  no headaches Endocrine: No unexpected weight changes Hematologic/Lymphatic:  no abnormal bleeding  Exam BP 108/68 (BP Location: Left Arm, Patient Position: Sitting, Cuff Size: Normal)   Pulse (!) 58   Temp 98.3 F (36.8 C) (Oral)   Ht 6\' 3"  (1.905 m)   Wt 224 lb (101.6 kg)   SpO2 98%   BMI 28.00 kg/m  General:  well developed, well nourished, in no apparent distress Skin:  no significant moles, warts, or growths Head:  no masses, lesions, or tenderness Eyes:  pupils equal and round, sclera anicteric without injection Ears:  canals without lesions, TMs shiny without retraction, no obvious effusion, no erythema Nose:  nares patent, septum midline, mucosa normal Throat/Pharynx:  lips and gingiva without lesion; tongue and uvula midline; non-inflamed pharynx; no exudates or postnasal drainage Neck: neck supple without adenopathy, thyromegaly, or masses Lungs:  clear to auscultation, breath sounds equal bilaterally, no respiratory distress Cardio:  regular rate and rhythm, no LE edema, no bruits Abdomen:  abdomen soft, nontender; bowel sounds  normal; no masses or organomegaly Genital (male): circumcised penis, no lesions or discharge; testes present bilaterally without masses or tenderness Rectal: Deferred Musculoskeletal:  symmetrical muscle groups noted without atrophy or deformity Extremities:  no clubbing, cyanosis, or edema, no deformities, no skin discoloration Neuro:  gait normal; deep tendon reflexes normal and symmetric Psych: well oriented with normal range of affect and appropriate judgment/insight  Assessment and Plan  Well adult exam - Plan: CBC,  Comprehensive metabolic panel, Lipid panel  Essential hypertension - Plan: lisinopril (ZESTRIL) 20 MG tablet  Coronary artery disease involving native heart without angina pectoris, unspecified vessel or lesion type - Plan: rosuvastatin (CRESTOR) 5 MG tablet   Well 63 y.o. male. Counseled on diet and exercise. Counseled on risks and benefits of prostate cancer screening with PSA. The patient agrees to forego testing. Immunizations, labs, and further orders as above. Follow up in 6 mo or prn. The patient voiced understanding and agreement to the plan.  [place], DO 02/09/19 7:56 AM

## 2019-02-25 ENCOUNTER — Other Ambulatory Visit: Payer: Self-pay | Admitting: Family Medicine

## 2019-03-26 ENCOUNTER — Other Ambulatory Visit: Payer: Self-pay | Admitting: Family Medicine

## 2019-04-22 ENCOUNTER — Other Ambulatory Visit: Payer: Self-pay | Admitting: Family Medicine

## 2019-05-21 ENCOUNTER — Other Ambulatory Visit: Payer: Self-pay | Admitting: Family Medicine

## 2019-06-25 ENCOUNTER — Other Ambulatory Visit: Payer: Self-pay | Admitting: Family Medicine

## 2019-07-23 ENCOUNTER — Other Ambulatory Visit: Payer: Self-pay | Admitting: Family Medicine

## 2019-08-17 ENCOUNTER — Ambulatory Visit: Payer: BC Managed Care – PPO | Admitting: Family Medicine

## 2019-08-27 ENCOUNTER — Other Ambulatory Visit: Payer: Self-pay | Admitting: Family Medicine

## 2019-08-29 ENCOUNTER — Encounter: Payer: Self-pay | Admitting: Family Medicine

## 2019-08-29 ENCOUNTER — Other Ambulatory Visit: Payer: Self-pay

## 2019-08-29 ENCOUNTER — Ambulatory Visit (INDEPENDENT_AMBULATORY_CARE_PROVIDER_SITE_OTHER): Payer: BC Managed Care – PPO | Admitting: Family Medicine

## 2019-08-29 VITALS — BP 130/80 | HR 103 | Temp 96.4°F | Ht 74.0 in | Wt 230.5 lb

## 2019-08-29 DIAGNOSIS — I10 Essential (primary) hypertension: Secondary | ICD-10-CM

## 2019-08-29 DIAGNOSIS — I2510 Atherosclerotic heart disease of native coronary artery without angina pectoris: Secondary | ICD-10-CM | POA: Diagnosis not present

## 2019-08-29 LAB — COMPREHENSIVE METABOLIC PANEL
ALT: 25 U/L (ref 0–53)
AST: 19 U/L (ref 0–37)
Albumin: 4.5 g/dL (ref 3.5–5.2)
Alkaline Phosphatase: 55 U/L (ref 39–117)
BUN: 19 mg/dL (ref 6–23)
CO2: 26 mEq/L (ref 19–32)
Calcium: 10 mg/dL (ref 8.4–10.5)
Chloride: 103 mEq/L (ref 96–112)
Creatinine, Ser: 1.18 mg/dL (ref 0.40–1.50)
GFR: 62.12 mL/min (ref >60.00)
Glucose, Bld: 97 mg/dL (ref 70–99)
Potassium: 4.4 mEq/L (ref 3.5–5.1)
Sodium: 137 mEq/L (ref 135–145)
Total Bilirubin: 1.1 mg/dL (ref 0.2–1.2)
Total Protein: 7.1 g/dL (ref 6.0–8.3)

## 2019-08-29 LAB — LIPID PANEL
Cholesterol: 145 mg/dL (ref 0–200)
HDL: 42.7 mg/dL (ref >39.00)
LDL Cholesterol: 77 mg/dL (ref 0–99)
NonHDL: 102.09
Total CHOL/HDL Ratio: 3
Triglycerides: 123 mg/dL (ref 0.0–149.0)
VLDL: 24.6 mg/dL (ref 0.0–40.0)

## 2019-08-29 MED ORDER — CARVEDILOL 3.125 MG PO TABS: 3.1250 mg | ORAL_TABLET | Freq: Two times a day (BID) | ORAL | 2 refills | Status: DC

## 2019-08-29 NOTE — Progress Notes (Signed)
Chief Complaint  Patient presents with  . Follow-up    6 month    Subjective Logan Fox is a 64 y.o. male who presents for hypertension follow up. He does not monitor home blood pressures. He is compliant with medications. Patient has these side effects of medication: none He is adhering to a healthy diet overall. Current exercise: elliptical and some wt resistance  CAD HX of CAD taking ASA and Crestor daily. Has some aches, does not drink a lot of water though. No CP or SOB.    Past Medical History:  Diagnosis Date  . Heart disease   . Hypertension     Review of Systems Cardiovascular: no chest pain Respiratory:  no shortness of breath  Exam BP 130/80 (BP Location: Left Arm, Patient Position: Sitting, Cuff Size: Normal)   Pulse (!) 103   Temp (!) 96.4 F (35.8 C) (Temporal)   Ht 6\' 2"  (1.88 m)   Wt 230 lb 8 oz (104.6 kg)   SpO2 97%   BMI 29.59 kg/m  General:  well developed, well nourished, in no apparent distress Heart: RRR (HR around 60 on my exam), no bruits, no LE edema Lungs: clear to auscultation, no accessory muscle use Psych: well oriented with normal range of affect and appropriate judgment/insight  Essential hypertension - Plan: Comprehensive metabolic panel  Coronary artery disease involving native heart without angina pectoris, unspecified vessel or lesion type - Plan: Comprehensive metabolic panel, Lipid panel  Cont ACEi, BB. Counseled on diet and exercise. Cont statin, consider CoQ10 and 55-60 oz of water daily to help with mild aches.  F/u in 6 mo or prn. The patient voiced understanding and agreement to the plan.  [place], DO 08/29/19  8:17 AM

## 2019-08-29 NOTE — Patient Instructions (Addendum)
Give [place] 2-3 business days to get the results of your labs back.   Keep the diet clean and stay active.  Consider CoQ10 to help with aches with the Crestor. Drinking 55-60 oz of water daily can also help.   Let us know if you need anything.

## 2019-12-07 ENCOUNTER — Other Ambulatory Visit: Payer: Self-pay | Admitting: Family Medicine

## 2019-12-07 DIAGNOSIS — M1A9XX0 Chronic gout, unspecified, without tophus (tophi): Secondary | ICD-10-CM

## 2019-12-07 NOTE — Telephone Encounter (Signed)
Medication: Colchicine (MITIGARE) 0.6 MG CAPS    Has the patient contacted their pharmacy? No. (If no, request that the patient contact the pharmacy for the refill.) (If yes, when and what did the pharmacy advise?)  Preferred Pharmacy (with phone number or street name): [facility] [address] [place], [place] - [address]  [address], [place] [address]  Phone:  [phone] Fax:  [phone]  DEA #:  --  Agent: Please be advised that RX refills may take up to 3 business days. We ask that you follow-up with your pharmacy.

## 2019-12-08 MED ORDER — COLCHICINE 0.6 MG PO CAPS: ORAL_CAPSULE | ORAL | 1 refills | Status: DC

## 2019-12-10 ENCOUNTER — Telehealth: Payer: Self-pay | Admitting: Family Medicine

## 2019-12-10 MED ORDER — COLCHICINE 0.6 MG PO CAPS: ORAL_CAPSULE | ORAL | 1 refills | Status: AC

## 2019-12-10 NOTE — Telephone Encounter (Signed)
Pharmacy needs ok to fill mitigare and along with taking carvedilol. [phone]

## 2019-12-10 NOTE — Telephone Encounter (Signed)
Called the pharmacy and informed

## 2019-12-10 NOTE — Addendum Note (Signed)
Addended by: Sharon Seller B on: 12/10/2019 08:35 AM   Modules accepted: Orders

## 2019-12-10 NOTE — Telephone Encounter (Signed)
OK 

## 2019-12-17 ENCOUNTER — Other Ambulatory Visit: Payer: Self-pay | Admitting: Family Medicine

## 2019-12-17 DIAGNOSIS — I10 Essential (primary) hypertension: Secondary | ICD-10-CM

## 2019-12-17 DIAGNOSIS — I2510 Atherosclerotic heart disease of native coronary artery without angina pectoris: Secondary | ICD-10-CM

## 2019-12-19 ENCOUNTER — Ambulatory Visit (INDEPENDENT_AMBULATORY_CARE_PROVIDER_SITE_OTHER): Payer: BC Managed Care – PPO | Admitting: Family Medicine

## 2019-12-19 ENCOUNTER — Encounter: Payer: Self-pay | Admitting: Family Medicine

## 2019-12-19 ENCOUNTER — Other Ambulatory Visit: Payer: Self-pay

## 2019-12-19 VITALS — BP 126/84 | HR 80 | Temp 96.2°F | Ht 74.0 in | Wt 228.4 lb

## 2019-12-19 DIAGNOSIS — M7751 Other enthesopathy of right foot: Secondary | ICD-10-CM

## 2019-12-19 MED ORDER — PREDNISONE 20 MG PO TABS: 40.0000 mg | ORAL_TABLET | Freq: Every day | ORAL | 0 refills | Status: AC

## 2019-12-19 NOTE — Patient Instructions (Addendum)
Ice/cold pack over area for 10-15 min twice daily.  OK to take Tylenol 1000 mg (2 extra strength tabs) or 975 mg (3 regular strength tabs) every 6 hours as needed.  Consider Turmeric, Glucosamine, Krill oil, fish oil. Avoid processed foods.  Achilles Tendinitis Rehab It is normal to feel mild stretching, pulling, tightness, or discomfort as you do these exercises, but you should stop right away if you feel sudden pain or your pain gets worse.   Stretching and range of motion exercises These exercises warm up your muscles and joints and improve the movement and flexibility of your ankle. These exercises also help to relieve pain, numbness, and tingling. Exercise A: Standing wall calf stretch, knee straight  1. Stand with your hands against a wall. 2. Extend your affected leg behind you and bend your front knee slightly. Keep both of your heels on the floor. 3. Point the toes of your back foot slightly inward. 4. Keeping your heels on the floor and your back knee straight, shift your weight toward the wall. Do not allow your back to arch. You should feel a gentle stretch in your calf. 5. Hold this position for seconds. Repeat 2 times. Complete this stretch 3 times per week Exercise B: Standing wall calf stretch, knee bent 1. Stand with your hands against a wall. 2. Extend your affected leg behind you, and bend your front knee slightly. Keep both of your heels on the floor. 3. Point the toes of your back foot slightly inward. 4. Keeping your heels on the floor, unlock your back knee so that it is bent. You should feel a gentle stretch deep in your calf. 5. Hold this position for 30 seconds. Repeat 2 times. Complete this stretch 3 times per week.  Strengthening exercises These exercises build strength and control of your ankle. Endurance is the ability to use your muscles for a long time, even after they get tired. Exercise C: Plantar flexion with band  1. Sit on the floor with your  affected leg extended. You may put a pillow under your calf to give your foot more room to move. 2. Loop a rubber exercise band or tube around the ball of your affected foot. The ball of your foot is on the walking surface, right under your toes. The band or tube should be slightly tense when your foot is relaxed. If the band or tube slips, you can put on your shoe or put a washcloth between the band and your foot to help it stay in place. 3. Slowly point your toes downward, pushing them away from you. 4. Hold this position for 10 seconds. 5. Slowly release the tension in the band or tube, controlling smoothly until your foot is back to the starting position. Repeat 2 times. Complete this exercise 3 times per week. Exercise D: Heel raise with eccentric lower  1. Stand on a step with the balls of your feet. The ball of your foot is on the walking surface, right under your toes. ? Do not put your heels on the step. ? For balance, rest your hands on the wall or on a railing. 2. Rise up onto the balls of your feet. 3. Keeping your heels up, shift all of your weight to your affected leg and pick up your other leg. 4. Slowly lower your affected leg so your heel drops below the level of the step. 5. Put down your foot. If told by your health care provider, build up to:  3 sets of 15 repetitions while keeping your knees straight.  3 sets of 15 repetitions while keeping your knees bent as far as told by your health care provider.  Complete this exercise 3 times per week. If this exercise is too easy, try doing it while wearing a backpack with weights in it. Balance exercises These exercises improve or maintain your balance. Balance is important in preventing falls. Exercise E: Single leg stand 1. Without shoes, stand near a railing or in a door frame. Hold on to the railing or door frame as needed. 2. Stand on your affected foot. Keep your big toe down on the floor and try to keep your arch  lifted. 3. Hold this position for 10 seconds. Repeat 2 times. Complete this exercise 3 times per week. If this exercise is too easy, you can try it with your eyes closed or while standing on a pillow. Make sure you discuss any questions you have with your health care provider. Document Released: 01/13/2005 Document Revised: 02/19/2016 Document Reviewed: 02/18/2015 Elsevier Interactive Patient Education  [employer].

## 2019-12-19 NOTE — Progress Notes (Signed)
Musculoskeletal Exam  Patient: Logan Fox DOB: [birth year]  DOS: 12/19/2019  SUBJECTIVE:  Chief Complaint:   Chief Complaint  Patient presents with  . Foot Pain    Logan Fox is a 64 y.o.  male for evaluation and treatment of R heel pain.   Onset:  7 weeks ago. No inj or change in activity.  Location: Achilles tendon Character:  sharp  Progression of issue:  has worsened Associated symptoms: redness Treatment: to date has been OTC NSAIDS.   Neurovascular symptoms: no   Past Medical History:  Diagnosis Date  . Heart disease   . Hypertension     Objective: VITAL SIGNS: BP 126/84 (BP Location: Left Arm, Patient Position: Sitting, Cuff Size: Normal)   Pulse 80   Temp (!) 96.2 F (35.7 C) (Temporal)   Ht 6\' 2"  (1.88 m)   Wt 228 lb 6 oz (103.6 kg)   SpO2 98%   BMI 29.32 kg/m  Constitutional: Well formed, well developed. No acute distress. Cardiovascular: Brisk cap refill Thorax & Lungs: No accessory muscle use Musculoskeletal: Right heel.   Tenderness to palpation: Yes, over the calcaneal insertion of the calcaneal tendon Deformity: no Ecchymosis: no There is redness over the area of tenderness without excessive warmth or fluctuance Neurologic: Normal sensory function.  Gait is antalgic Psychiatric: Normal mood. Age appropriate judgment and insight. Alert & oriented x 3.    Assessment:  Right calcaneal bursitis - Plan: predniSONE (DELTASONE) 20 MG tablet  Plan: Orders as above.  Ice.  Activity as tolerated.  Tylenol.  Stretches and exercises provided.  Could put in a boot, he would like to try [place] sock. F/u as needed. The patient voiced understanding and agreement to the plan.   [place], DO 12/19/19  4:47 PM

## 2020-02-29 ENCOUNTER — Encounter: Payer: Self-pay | Admitting: Family Medicine

## 2020-02-29 ENCOUNTER — Encounter: Payer: Self-pay | Admitting: Gastroenterology

## 2020-02-29 ENCOUNTER — Other Ambulatory Visit: Payer: Self-pay

## 2020-02-29 ENCOUNTER — Ambulatory Visit (INDEPENDENT_AMBULATORY_CARE_PROVIDER_SITE_OTHER): Payer: BC Managed Care – PPO | Admitting: Family Medicine

## 2020-02-29 VITALS — BP 110/68 | HR 90 | Temp 97.6°F | Ht 74.0 in | Wt 229.2 lb

## 2020-02-29 DIAGNOSIS — Z0000 Encounter for general adult medical examination without abnormal findings: Secondary | ICD-10-CM

## 2020-02-29 DIAGNOSIS — Z1283 Encounter for screening for malignant neoplasm of skin: Secondary | ICD-10-CM

## 2020-02-29 DIAGNOSIS — Z1211 Encounter for screening for malignant neoplasm of colon: Secondary | ICD-10-CM

## 2020-02-29 NOTE — Progress Notes (Signed)
Chief Complaint  Patient presents with  . Follow-up    [name] is here for a complete physical.   His last physical was >1 year ago.  Current diet: in general, diet could be bette.  Current exercise: core workouts, cardio, wt resistance Weight trend: gained a few lbs Fatigue out of ordinary? No. Seat belt? Yes.    Health maintenance Shingrix- Yes Colonoscopy- No Tetanus- Yes HIV- Yes Hep C- Yes   Past Medical History:  Diagnosis Date  . Heart disease   . Hypertension       Past Surgical History:  Procedure Laterality Date  . NO PAST SURGERIES      Medications  Current Outpatient Medications on File Prior to Visit  Medication Sig Dispense Refill  . aspirin EC 81 MG tablet Take 81 mg by mouth daily.    . carvedilol (COREG) 3.125 MG tablet Take 1 tablet (3.125 mg total) by mouth 2 (two) times daily with a meal. 180 tablet 2  . Colchicine (MITIGARE) 0.6 MG CAPS 2 caps now, 1 cap 1 hour later.  If symptoms persist, you may repeat the following day. 30 capsule 1  . lisinopril (ZESTRIL) 20 MG tablet Take 1 tablet by mouth once daily 90 tablet 1  . rosuvastatin (CRESTOR) 5 MG tablet Take 1 tablet by mouth once daily 90 tablet 1   Allergies No Known Allergies  Family History Family History  Problem Relation Age of Onset  . Arthritis Mother   . Cancer Mother   . Diabetes Mother   . Heart disease Mother   . Hypertension Mother   . Alcohol abuse Father   . Arthritis Father   . Diabetes Father   . Heart disease Father   . Hypertension Father   . Cancer Sister   . Diabetes Sister   . Hypertension Sister   . Kidney disease Sister   . Diabetes Brother   . Hyperlipidemia Brother   . Hypertension Brother     Review of Systems: Constitutional:  no fevers Eye:  no recent significant change in vision Ear/Nose/Mouth/Throat:  Ears:  no hearing loss Nose/Mouth/Throat:  no complaints of nasal congestion, no sore throat Cardiovascular:  no chest  pain Respiratory:  no shortness of breath Gastrointestinal:  no change in bowel habits GU:  Male: negative for dysuria, frequency Musculoskeletal/Extremities:  No new joint pain Integumentary (Skin/Breast):  no abnormal skin lesions reported Neurologic:  no headaches Endocrine: No unexpected weight changes Hematologic/Lymphatic:  no abnormal bleeding  Exam BP 110/68 (BP Location: Left Arm, Patient Position: Sitting, Cuff Size: Normal)   Pulse 90   Temp 97.6 F (36.4 C) (Oral)   Ht 6\' 2"  (1.88 m)   Wt 229 lb 4 oz (104 kg)   SpO2 96%   BMI 29.43 kg/m  General:  well developed, well nourished, in no apparent distress Skin:  no significant moles, warts, or growths Head:  no masses, lesions, or tenderness Eyes:  pupils equal and round, sclera anicteric without injection Ears:  canals without lesions, TMs shiny without retraction, no obvious effusion, no erythema Nose:  nares patent, septum midline, mucosa normal Throat/Pharynx:  lips and gingiva without lesion; tongue and uvula midline; non-inflamed pharynx; no exudates or postnasal drainage Neck: neck supple without adenopathy, thyromegaly, or masses Cardiac: RRR, no bruits, no LE edema Lungs:  clear to auscultation, breath sounds equal bilaterally, no respiratory distress Rectal: Deferred Musculoskeletal:  symmetrical muscle groups noted without atrophy or deformity Neuro:  gait normal;  deep tendon reflexes normal and symmetric Psych: well oriented with normal range of affect and appropriate judgment/insight  Assessment and Plan  Well adult exam - Plan: CBC, Comprehensive metabolic panel, Lipid panel  Screen for colon cancer - Plan: Ambulatory referral to Gastroenterology  Screening for skin cancer - Plan: Ambulatory referral to Dermatology   Well 64 y.o. male. Counseled on diet and exercise. Counseled on risks and benefits of prostate cancer screening with PSA. The patient agrees to forego testing. Immunizations, labs, and  further orders as above. Follow up in 6 mo or prn. The patient voiced understanding and agreement to the plan.  [place], DO 02/29/20 8:53 AM

## 2020-02-29 NOTE — Patient Instructions (Addendum)
Give [place] 2-3 business days to get the results of your labs back.   Keep the diet clean and stay active.  I recommend getting the flu shot in mid October. This suggestion would change if the CDC comes out with a different recommendation.   Can you send me the dates you had your vaccine on MyChart please?   Let me know if you are having issues with your heel, let me know and we can set you up with our sports medicine team.   If you do not hear anything about your referrals in the next 1-2 weeks, call our office and ask for an update.  Let us know if you need anything.

## 2020-03-01 LAB — LIPID PANEL
Cholesterol: 146 mg/dL (ref <200)
HDL: 49 mg/dL (ref >=40)
LDL Cholesterol (Calc): 80 mg/dL (calc)
Non-HDL Cholesterol (Calc): 97 mg/dL (calc) (ref <130)
Total CHOL/HDL Ratio: 3 (calc) (ref <5.0)
Triglycerides: 89 mg/dL (ref <150)

## 2020-03-01 LAB — COMPREHENSIVE METABOLIC PANEL
AG Ratio: 2 (calc) (ref 1.0–2.5)
ALT: 20 U/L (ref 9–46)
AST: 16 U/L (ref 10–35)
Albumin: 4.4 g/dL (ref 3.6–5.1)
Alkaline phosphatase (APISO): 52 U/L (ref 35–144)
BUN: 15 mg/dL (ref 7–25)
CO2: 23 mmol/L (ref 20–32)
Calcium: 9.6 mg/dL (ref 8.6–10.3)
Chloride: 106 mmol/L (ref 98–110)
Creat: 1.01 mg/dL (ref 0.70–1.25)
Globulin: 2.2 g/dL (calc) (ref 1.9–3.7)
Glucose, Bld: 103 mg/dL — ABNORMAL HIGH (ref 65–99)
Potassium: 4.3 mmol/L (ref 3.5–5.3)
Sodium: 140 mmol/L (ref 135–146)
Total Bilirubin: 0.8 mg/dL (ref 0.2–1.2)
Total Protein: 6.6 g/dL (ref 6.1–8.1)

## 2020-03-01 LAB — CBC
HCT: 45.1 % (ref 38.5–50.0)
Hemoglobin: 15.6 g/dL (ref 13.2–17.1)
MCH: 33.3 pg — ABNORMAL HIGH (ref 27.0–33.0)
MCHC: 34.6 g/dL (ref 32.0–36.0)
MCV: 96.2 fL (ref 80.0–100.0)
MPV: 11 fL (ref 7.5–12.5)
Platelets: 199 10*3/uL (ref 140–400)
RBC: 4.69 10*6/uL (ref 4.20–5.80)
RDW: 11.9 % (ref 11.0–15.0)
WBC: 5.6 10*3/uL (ref 3.8–10.8)

## 2020-03-07 ENCOUNTER — Telehealth: Payer: Self-pay | Admitting: Family Medicine

## 2020-03-07 NOTE — Telephone Encounter (Signed)
[facility]  Fax :[phone]  Calling in reference to patient's referral and insurance cards. Requesting insurance card be faxed to make appointment for patient.

## 2020-04-03 ENCOUNTER — Telehealth: Payer: Self-pay | Admitting: Family Medicine

## 2020-04-03 NOTE — Telephone Encounter (Signed)
Patient is requesting an order be placed for tb testing for employment requirements.  Please advise

## 2020-04-04 NOTE — Telephone Encounter (Signed)
Advise if ok to sch. Nurse visit for this

## 2020-04-04 NOTE — Telephone Encounter (Signed)
Called patient to schedule appointment for TB , LVM to call back

## 2020-04-04 NOTE — Telephone Encounter (Signed)
That's fine

## 2020-04-16 ENCOUNTER — Other Ambulatory Visit: Payer: Self-pay

## 2020-04-16 ENCOUNTER — Ambulatory Visit (INDEPENDENT_AMBULATORY_CARE_PROVIDER_SITE_OTHER): Payer: BC Managed Care – PPO | Admitting: *Deleted

## 2020-04-16 DIAGNOSIS — Z111 Encounter for screening for respiratory tuberculosis: Secondary | ICD-10-CM

## 2020-04-16 NOTE — Progress Notes (Signed)
Pt here for tb skin for work. Tb was given in Left forearm.Pt tolerated well

## 2020-04-18 LAB — TB SKIN TEST
Induration: 0 mm
TB Skin Test: NEGATIVE

## 2020-04-25 ENCOUNTER — Other Ambulatory Visit: Payer: Self-pay

## 2020-05-05 ENCOUNTER — Encounter: Payer: Self-pay | Admitting: Gastroenterology

## 2020-05-09 ENCOUNTER — Encounter: Payer: BC Managed Care – PPO | Admitting: Gastroenterology

## 2020-05-27 ENCOUNTER — Ambulatory Visit (AMBULATORY_SURGERY_CENTER): Payer: Self-pay

## 2020-05-27 ENCOUNTER — Other Ambulatory Visit: Payer: Self-pay

## 2020-05-27 VITALS — Ht 74.0 in | Wt 228.0 lb

## 2020-05-27 DIAGNOSIS — Z1211 Encounter for screening for malignant neoplasm of colon: Secondary | ICD-10-CM

## 2020-05-27 DIAGNOSIS — Z800 Family history of malignant neoplasm of digestive organs: Secondary | ICD-10-CM

## 2020-05-27 MED ORDER — SUTAB 1479-225-188 MG PO TABS: 12.0000 | ORAL_TABLET | ORAL | 0 refills | Status: DC

## 2020-05-27 NOTE — Progress Notes (Signed)
Pt verified name, DOB, address and insurance during PV today.    Pt mailed instruction packet to included paper to complete and mail back to [facility] with addressed and stamped envelope,copy of consent form to read and not return, and instructions. Sutab coupon mailed in packet.   PV completed over the phone.   Pt encouraged to call with questions or issues.  Self report wt. 228 lb in last few weeks    No allergies to soy or egg Pt is not on blood thinners or diet pills Denies issues with sedation/intubation Denies atrial flutter/fib Denies constipation   Emmi instructions given to pt  Pt is aware of Covid safety and care partner requirements.

## 2020-05-30 ENCOUNTER — Encounter: Payer: Self-pay | Admitting: Gastroenterology

## 2020-06-06 ENCOUNTER — Ambulatory Visit (INDEPENDENT_AMBULATORY_CARE_PROVIDER_SITE_OTHER): Payer: Medicare HMO | Admitting: Family Medicine

## 2020-06-06 ENCOUNTER — Other Ambulatory Visit: Payer: Self-pay

## 2020-06-06 ENCOUNTER — Encounter: Payer: Self-pay | Admitting: Family Medicine

## 2020-06-06 VITALS — BP 140/92 | HR 63 | Temp 97.9°F | Ht 74.0 in | Wt 230.2 lb

## 2020-06-06 DIAGNOSIS — R030 Elevated blood-pressure reading, without diagnosis of hypertension: Secondary | ICD-10-CM | POA: Diagnosis not present

## 2020-06-06 DIAGNOSIS — M79671 Pain in right foot: Secondary | ICD-10-CM

## 2020-06-06 MED ORDER — MELOXICAM 7.5 MG PO TABS: 7.5000 mg | ORAL_TABLET | Freq: Every day | ORAL | 0 refills | Status: DC

## 2020-06-06 NOTE — Patient Instructions (Signed)
Listen to your body.  If you do not hear anything about your referral in the next 1-2 weeks, call our office and ask for an update.  Ice/cold pack over area for 10-15 min twice daily.  OK to take Tylenol 1000 mg (2 extra strength tabs) or 975 mg (3 regular strength tabs) every 6 hours as needed.  No more Aleve.   If you change your mind about the boot, let me know.   Let us know if you need anything.   Achilles Tendinitis Rehab It is normal to feel mild stretching, pulling, tightness, or discomfort as you do these exercises, but you should stop right away if you feel sudden pain or your pain gets worse.   Stretching and range of motion exercises These exercises warm up your muscles and joints and improve the movement and flexibility of your ankle. These exercises also help to relieve pain, numbness, and tingling. Exercise A: Standing wall calf stretch, knee straight  1. Stand with your hands against a wall. 2. Extend your affected leg behind you and bend your front knee slightly. Keep both of your heels on the floor. 3. Point the toes of your back foot slightly inward. 4. Keeping your heels on the floor and your back knee straight, shift your weight toward the wall. Do not allow your back to arch. You should feel a gentle stretch in your calf. 5. Hold this position for seconds. Repeat 2 times. Complete this stretch 3 times per week Exercise B: Standing wall calf stretch, knee bent 1. Stand with your hands against a wall. 2. Extend your affected leg behind you, and bend your front knee slightly. Keep both of your heels on the floor. 3. Point the toes of your back foot slightly inward. 4. Keeping your heels on the floor, unlock your back knee so that it is bent. You should feel a gentle stretch deep in your calf. 5. Hold this position for 30 seconds. Repeat 2 times. Complete this stretch 3 times per week.  Strengthening exercises These exercises build strength and control of your  ankle. Endurance is the ability to use your muscles for a long time, even after they get tired. Exercise C: Plantar flexion with band  1. Sit on the floor with your affected leg extended. You may put a pillow under your calf to give your foot more room to move. 2. Loop a rubber exercise band or tube around the ball of your affected foot. The ball of your foot is on the walking surface, right under your toes. The band or tube should be slightly tense when your foot is relaxed. If the band or tube slips, you can put on your shoe or put a washcloth between the band and your foot to help it stay in place. 3. Slowly point your toes downward, pushing them away from you. 4. Hold this position for 10 seconds. 5. Slowly release the tension in the band or tube, controlling smoothly until your foot is back to the starting position. Repeat 2 times. Complete this exercise 3 times per week. Exercise D: Heel raise with eccentric lower  1. Stand on a step with the balls of your feet. The ball of your foot is on the walking surface, right under your toes. ? Do not put your heels on the step. ? For balance, rest your hands on the wall or on a railing. 2. Rise up onto the balls of your feet. 3. Keeping your heels up, shift all of  your weight to your affected leg and pick up your other leg. 4. Slowly lower your affected leg so your heel drops below the level of the step. 5. Put down your foot. If told by your health care provider, build up to:  3 sets of 15 repetitions while keeping your knees straight.  3 sets of 15 repetitions while keeping your knees bent as far as told by your health care provider.  Complete this exercise 3 times per week. If this exercise is too easy, try doing it while wearing a backpack with weights in it. Balance exercises These exercises improve or maintain your balance. Balance is important in preventing falls. Exercise E: Single leg stand 1. Without shoes, stand near a railing or  in a door frame. Hold on to the railing or door frame as needed. 2. Stand on your affected foot. Keep your big toe down on the floor and try to keep your arch lifted. 3. Hold this position for 10 seconds. Repeat 2 times. Complete this exercise 3 times per week. If this exercise is too easy, you can try it with your eyes closed or while standing on a pillow. Make sure you discuss any questions you have with your health care provider. Document Released: 01/13/2005 Document Revised: 02/19/2016 Document Reviewed: 02/18/2015 Elsevier Interactive Patient Education  [employer].

## 2020-06-06 NOTE — Progress Notes (Signed)
Musculoskeletal Exam  Patient: Logan Fox DOB: 09-04-1955  DOS: 06/06/2020  SUBJECTIVE:  Chief Complaint:   Chief Complaint  Patient presents with  . Tendonitis  . Ankle Pain    Right ankle pain     Logan Fox is a 64 y.o.  male for evaluation and treatment of R ankle pain.   Onset:  8 months ago. No inj or change in activity.  Location: R heel Character:  sharp and stabbing  Progression of issue:  has worsened Associated symptoms: thickening of the tendon Treatment: to date has been ice, OTC NSAIDS, home exercises and prednisone.   Neurovascular symptoms: no  Past Medical History:  Diagnosis Date  . Heart disease   . Hyperlipidemia   . Hypertension     Objective: VITAL SIGNS: BP (!) 140/92 (BP Location: Left Arm, Patient Position: Sitting, Cuff Size: Normal)   Pulse 63   Temp 97.9 F (36.6 C) (Oral)   Ht 6\' 2"  (1.88 m)   Wt 230 lb 4 oz (104.4 kg)   SpO2 98%   BMI 29.56 kg/m  Constitutional: Well formed, well developed. No acute distress. Thorax & Lungs: No accessory muscle use Musculoskeletal: R heel.   Normal active range of motion: yes.   Normal passive range of motion: yes Tenderness to palpation: yes over distal calcaneal tendon Deformity: mild thickening over area of tenderson Ecchymosis: no Tests positive: none Tests negative: [place] Neurologic: Normal sensory function. Psychiatric: Normal mood. Age appropriate judgment and insight. Alert & oriented x 3.    Assessment:  Pain of right heel - Plan: Ambulatory referral to Sports Medicine, meloxicam (MOBIC) 7.5 MG tablet  Elevated blood pressure reading  Plan: 1. Declined boot. Refer sports med. Mobic. Stretches/exercises, heat, ice, Tylenol.  2. Monitor at home. Usually normal.  F/u as originally scheduled. The patient voiced understanding and agreement to the plan.   [place], DO 06/06/20  8:35 AM

## 2020-06-09 ENCOUNTER — Encounter: Payer: Self-pay | Admitting: Gastroenterology

## 2020-06-12 ENCOUNTER — Encounter: Payer: Self-pay | Admitting: Gastroenterology

## 2020-06-12 ENCOUNTER — Other Ambulatory Visit: Payer: Self-pay

## 2020-06-12 ENCOUNTER — Ambulatory Visit (AMBULATORY_SURGERY_CENTER): Payer: Medicare HMO | Admitting: Gastroenterology

## 2020-06-12 VITALS — BP 138/74 | HR 50 | Temp 97.3°F | Resp 12 | Ht 74.0 in | Wt 228.0 lb

## 2020-06-12 DIAGNOSIS — Z1211 Encounter for screening for malignant neoplasm of colon: Secondary | ICD-10-CM | POA: Diagnosis present

## 2020-06-12 DIAGNOSIS — D123 Benign neoplasm of transverse colon: Secondary | ICD-10-CM | POA: Diagnosis not present

## 2020-06-12 DIAGNOSIS — Z800 Family history of malignant neoplasm of digestive organs: Secondary | ICD-10-CM | POA: Diagnosis not present

## 2020-06-12 MED ORDER — SODIUM CHLORIDE 0.9 % IV SOLN: 500.0000 mL | Freq: Once | INTRAVENOUS | Status: DC

## 2020-06-12 NOTE — Progress Notes (Signed)
Vital signs checked by:CW ? ?The medical and surgical history was reviewed and verified with the patient. ? ?

## 2020-06-12 NOTE — Patient Instructions (Signed)
YOU HAD AN ENDOSCOPIC PROCEDURE TODAY AT THE [facility]:   Refer to the procedure report that was given to you for any specific questions about what was found during the examination.  If the procedure report does not answer your questions, please call your gastroenterologist to clarify.  If you requested that your care partner not be given the details of your procedure findings, then the procedure report has been included in a sealed envelope for you to review at your convenience later.  YOU SHOULD EXPECT: Some feelings of bloating in the abdomen. Passage of more gas than usual.  Walking can help get rid of the air that was put into your GI tract during the procedure and reduce the bloating. If you had a lower endoscopy (such as a colonoscopy or flexible sigmoidoscopy) you may notice spotting of blood in your stool or on the toilet paper. If you underwent a bowel prep for your procedure, you may not have a normal bowel movement for a few days.  Please Note:  You might notice some irritation and congestion in your nose or some drainage.  This is from the oxygen used during your procedure.  There is no need for concern and it should clear up in a day or so.  SYMPTOMS TO REPORT IMMEDIATELY:   Following lower endoscopy (colonoscopy or flexible sigmoidoscopy):  Excessive amounts of blood in the stool  Significant tenderness or worsening of abdominal pains  Swelling of the abdomen that is new, acute  Fever of 100F or higher  For urgent or emergent issues, a gastroenterologist can be reached at any hour by calling [phone]. Do not use MyChart messaging for urgent concerns.    DIET:  We do recommend a small meal at first, but then you may proceed to your regular diet.  Drink plenty of fluids but you should avoid alcoholic beverages for 24 hours.  ACTIVITY:  You should plan to take it easy for the rest of today and you should NOT DRIVE or use heavy machinery until tomorrow (because  of the sedation medicines used during the test).    FOLLOW UP: Our staff will call the number listed on your records 48-72 hours following your procedure to check on you and address any questions or concerns that you may have regarding the information given to you following your procedure. If we do not reach you, we will leave a message.  We will attempt to reach you two times.  During this call, we will ask if you have developed any symptoms of COVID 19. If you develop any symptoms (ie: fever, flu-like symptoms, shortness of breath, cough etc.) before then, please call [phone].  If you test positive for Covid 19 in the 2 weeks post procedure, please call and report this information to us.    If any biopsies were taken you will be contacted by phone or by letter within the next 1-3 weeks.  Please call us at [phone] if you have not heard about the biopsies in 3 weeks.    SIGNATURES/CONFIDENTIALITY: You and/or your care partner have signed paperwork which will be entered into your electronic medical record.  These signatures attest to the fact that that the information above on your After Visit Summary has been reviewed and is understood.  Full responsibility of the confidentiality of this discharge information lies with you and/or your care-partner. 

## 2020-06-12 NOTE — Progress Notes (Signed)
Called to room to assist during endoscopic procedure.  Patient ID and intended procedure confirmed with present staff. Received instructions for my participation in the procedure from the performing physician.  

## 2020-06-12 NOTE — Op Note (Signed)
[facility] Patient Name: Logan Fox Procedure Date: 06/12/2020 11:19 AM MRN: [id number] Endoscopist: Jackquline Denmark , MD Age: 64 Referring MD:  Date of Birth: 28-Aug-1955 Gender: Male Account #: [id number] Procedure:                Colonoscopy Indications:              Screening in patient at increased risk: Colorectal                            cancer in mother Medicines:                Monitored Anesthesia Care Procedure:                Pre-Anesthesia Assessment:                           - Prior to the procedure, a History and Physical                            was performed, and patient medications and                            allergies were reviewed. The patient's tolerance of                            previous anesthesia was also reviewed. The risks                            and benefits of the procedure and the sedation                            options and risks were discussed with the patient.                            All questions were answered, and informed consent                            was obtained. Prior Anticoagulants: The patient has                            taken no previous anticoagulant or antiplatelet                            agents. ASA Grade Assessment: II - A patient with                            mild systemic disease. After reviewing the risks                            and benefits, the patient was deemed in                            satisfactory condition to undergo the procedure.  After obtaining informed consent, the colonoscope                            was passed under direct vision. Throughout the                            procedure, the patient's blood pressure, pulse, and                            oxygen saturations were monitored continuously. The                            Olympus CF-HQ190L [id number]) Colonoscope was                            introduced through the anus and advanced to the 2                             cm into the ileum. The colonoscopy was performed                            without difficulty. The patient tolerated the                            procedure well. The quality of the bowel                            preparation was good. The terminal ileum, ileocecal                            valve, appendiceal orifice, and rectum were                            photographed. Scope In: 11:24:03 AM Scope Out: 11:39:17 AM Scope Withdrawal Time: 0 hours 12 minutes 31 seconds  Total Procedure Duration: 0 hours 15 minutes 14 seconds  Findings:                 A 4 mm polyp was found in the proximal transverse                            colon. The polyp was sessile. The polyp was removed                            with a cold biopsy forceps. Resection and retrieval                            were complete.                           A few small-mouthed diverticula were found in the                            sigmoid colon.  Non-bleeding internal hemorrhoids were found during                            retroflexion. The hemorrhoids were small.                           The terminal ileum appeared normal.                           The exam was otherwise without abnormality on                            direct and retroflexion views. Complications:            No immediate complications. Estimated Blood Loss:     Estimated blood loss: none. Impression:               - One 4 mm polyp in the proximal transverse colon,                            removed with a cold biopsy forceps. Resected and                            retrieved.                           - Mild sigmoid diverticulosis.                           - Non-bleeding internal hemorrhoids.                           - The examined portion of the ileum was normal.                           - The examination was otherwise normal on direct                            and retroflexion  views. Recommendation:           - Patient has a contact number available for                            emergencies. The signs and symptoms of potential                            delayed complications were discussed with the                            patient. Return to normal activities tomorrow.                            Written discharge instructions were provided to the                            patient.                           -  Resume previous diet.                           - Continue present medications.                           - Await pathology results.                           - Repeat colonoscopy for surveillance based on                            pathology results.                           - The findings and recommendations were discussed                            with the patient's wife [name]. Jackquline Denmark, MD 06/12/2020 11:44:58 AM This report has been signed electronically.

## 2020-06-12 NOTE — Progress Notes (Signed)
PT taken to PACU. Monitors in place. VSS. Report given to RN. 

## 2020-06-16 ENCOUNTER — Telehealth: Payer: Self-pay

## 2020-06-16 ENCOUNTER — Other Ambulatory Visit: Payer: Self-pay | Admitting: Family Medicine

## 2020-06-16 DIAGNOSIS — I10 Essential (primary) hypertension: Secondary | ICD-10-CM

## 2020-06-16 DIAGNOSIS — I2510 Atherosclerotic heart disease of native coronary artery without angina pectoris: Secondary | ICD-10-CM

## 2020-06-16 NOTE — Telephone Encounter (Signed)
  Follow up Call-  Call back number 06/12/2020  Post procedure Call Back phone  # [phone]  Permission to leave phone message Yes  Some recent data might be hidden     Patient questions:  Do you have a fever, pain , or abdominal swelling? No. Pain Score  0 *  Have you tolerated food without any problems? Yes.    Have you been able to return to your normal activities? Yes.    Do you have any questions about your discharge instructions: Diet   No. Medications  No. Follow up visit  No.  Do you have questions or concerns about your Care? No.  Actions: * If pain score is 4 or above: No action needed, pain <4.  1. Have you developed a fever since your procedure? no  2.   Have you had an respiratory symptoms (SOB or cough) since your procedure? no  3.   Have you tested positive for COVID 19 since your procedure no  4.   Have you had any family members/close contacts diagnosed with the COVID 19 since your procedure?  no   If yes to any of these questions please route to Joylene John, RN and Joella Prince, RN

## 2020-07-03 ENCOUNTER — Encounter: Payer: Self-pay | Admitting: Gastroenterology

## 2020-07-12 ENCOUNTER — Other Ambulatory Visit: Payer: Self-pay | Admitting: Family Medicine

## 2020-07-12 DIAGNOSIS — M79671 Pain in right foot: Secondary | ICD-10-CM

## 2020-08-29 ENCOUNTER — Other Ambulatory Visit: Payer: Self-pay

## 2020-08-29 ENCOUNTER — Ambulatory Visit (INDEPENDENT_AMBULATORY_CARE_PROVIDER_SITE_OTHER): Payer: Medicare HMO | Admitting: Family Medicine

## 2020-08-29 ENCOUNTER — Encounter: Payer: Self-pay | Admitting: Family Medicine

## 2020-08-29 VITALS — BP 122/82 | HR 44 | Temp 98.1°F | Resp 17 | Ht 74.0 in | Wt 224.0 lb

## 2020-08-29 DIAGNOSIS — I10 Essential (primary) hypertension: Secondary | ICD-10-CM

## 2020-08-29 DIAGNOSIS — M6208 Separation of muscle (nontraumatic), other site: Secondary | ICD-10-CM | POA: Diagnosis not present

## 2020-08-29 DIAGNOSIS — I25118 Atherosclerotic heart disease of native coronary artery with other forms of angina pectoris: Secondary | ICD-10-CM | POA: Diagnosis not present

## 2020-08-29 NOTE — Progress Notes (Signed)
Chief Complaint  Patient presents with  . Hypertension    6 month follow up, med check   . Hernia    Subjective Logan Fox is a 65 y.o. male who presents for hypertension follow up. He does monitor home blood pressures. Blood pressures ranging from 130's/80's on average. He is compliant with medications- lisnopril 20 mg/d, Coreg 3.125 mg bid. Patient has these side effects of medication: none He is adhering to a healthy diet overall. Current exercise: wt lifting, cardio  CAD Crestor MWF as he gets aches. Stays hydrated. No CP or SOB. Reports compliance with above.  Hernia over central abd. No pain, notices when he does ab work. Denies skin changes or bowel changes.     Past Medical History:  Diagnosis Date  . Heart disease   . Hypertension     Exam BP 122/82 (BP Location: Left Arm, Patient Position: Sitting, Cuff Size: Normal)   Pulse (!) 44   Temp 98.1 F (36.7 C)   Resp 17   Ht 6\' 2"  (1.88 m)   Wt 224 lb (101.6 kg)   SpO2 99%   BMI 28.76 kg/m  General:  well developed, well nourished, in no apparent distress Heart: Reg rhythm, bradycardic, no bruits, no LE edema Lungs: clear to auscultation, no accessory muscle use Abd: midline protrusion w valsalva. No ttp, distension, skin change.  Psych: well oriented with normal range of affect and appropriate judgment/insight  Essential hypertension  Coronary artery disease of native heart with stable angina pectoris, unspecified vessel or lesion type ([place])  Diastasis recti  1. Cont Lisinopril 20 mg/d. Counseled on diet and exercise. 2. Cont Crestor 5 mg TID. Stay hydrated. Cont Coreg 3.125 mg bid.  3. Reassurance and education provided.  F/u in 6 mo for CPE or prn. The patient voiced understanding and agreement to the plan.  [place], DO 08/29/20  8:01 AM

## 2020-08-29 NOTE — Patient Instructions (Addendum)
Keep the diet clean and stay active.  Let us know if you need anything.   Diastasis Recti  Diastasis recti is a condition in which the muscles of the abdomen (rectus abdominis muscles) become thin and separate. The result is a wider space between the muscles of the right and left abdomen (abdominal muscles). This wider space between the muscles may cause a bulge in the middle of the abdomen. This bulge may be noticed when a person is straining or when he or she sits up after lying down. Diastasis recti can affect men and women. It is most common among pregnant women, babies, people with obesity, and people who have had abdominal surgery. Exercise or surgery may help correct this condition. What are the causes? Common causes of this condition include:  Pregnancy. As the uterus grows in size, it puts pressure on the abdominal muscles, causing the muscles to separate.  Obesity. Excess fat puts pressure on abdominal muscles.  Weight lifting.  Some exercises of the abdomen.  Advanced age.  Genetics.  Having had surgery on the abdomen before. What increases the risk? This condition is more likely to develop in:  Women.  Newborns, especially newborns who are born early (prematurely). What are the signs or symptoms? Common symptoms of this condition include:  A bulge in the middle of your abdomen. You will notice it most when you sit up or strain.  Pain in your low back, hips, or the area between your hip bones (pelvis).  Constipation.  Being unable to control when you urinate (urinary incontinence).  Bloating.  Poor posture. How is this diagnosed? This condition is diagnosed with a physical exam. During the exam, your health care provider will ask you to lie flat on your back and do a crunch or half sit-up. If you have diastasis recti, a bulge will appear lengthwise between your abdominal muscles in the center of your abdomen. Your health care provider will measure the gap  between your muscles with one of the following:  A medical device used to measure the space between two objects (caliper).  A tape measure.  CT scan.  Ultrasound.  Finger spaces. Your health care provider will measure the space using his or her fingers. How is this treated? If your muscle separation is not too large, you may not need treatment. However, if you are a woman who plans to become pregnant again, you should treat this condition before your next pregnancy. Treatment may include:  Physical therapy exercises to strengthen and tighten your abdominal muscles.  Lifestyle changes such as weight loss and exercise.  Over-the-counter pain medicines as needed.  Surgery to correct the separation. Follow these instructions at home: Activity  Return to your normal activities as told by your health care provider. Ask your health care provider what activities are safe for you.  Do exercises as told by your health care provider. Make sure you are doing your exercises and movements correctly when lifting weights or doing exercises using your abdominal muscles or the muscles in the center of your body that give stability (core muscles). Proper form can help to prevent this condition from happening again. General instructions  If you are overweight, ask your health care provider for help with weight loss. Losing even a small amount of weight can help to improve your diastasis recti.  Take over-the-counter or prescription medicines only as told by your health care provider.  Do not strain. Straining can make the separation worse. Examples of straining include: ?  Pushing hard to have a bowel movement, such as when you have constipation. ? Lifting heavy objects or lifting children. ? Standing up and sitting down.  You may need to take these actions to prevent or treat constipation: ? Drink enough fluid to keep your urine pale yellow. ? Take over-the-counter or prescription  medicines. ? Eat foods that are high in fiber, such as beans, whole grains, and fresh fruits and vegetables. ? Limit foods that are high in fat and processed sugars, such as fried or sweet foods.  Keep all follow-up visits. This is important. Contact a health care provider if:  You notice a new bulge in your abdomen. Get help right away if:  You experience severe discomfort in your abdomen.  You develop severe abdominal pain along with nausea, vomiting, or a fever. Summary  Diastasis recti is a condition in which the muscles of the abdomen (rectus abdominismuscles) become thin and separate. You may notice a bulge in your abdomen because the space has widened between the muscles of the right and left abdomen.  The most common symptom is a bulge in the middle of your abdomen. You will notice it most when you sit up or strain.  This condition is diagnosed with a physical exam.  If the muscle separation is not too big, you may not need treatment. Otherwise, you may need to do physical therapy or have surgery. This information is not intended to replace advice given to you by your health care provider. Make sure you discuss any questions you have with your health care provider. Document Revised: 02/15/2020 Document Reviewed: 02/15/2020 Elsevier Patient Education  [place].

## 2020-09-08 ENCOUNTER — Other Ambulatory Visit: Payer: Self-pay | Admitting: Family Medicine

## 2020-09-08 DIAGNOSIS — I10 Essential (primary) hypertension: Secondary | ICD-10-CM

## 2020-09-16 ENCOUNTER — Other Ambulatory Visit: Payer: Self-pay | Admitting: Family Medicine

## 2020-09-16 DIAGNOSIS — I2510 Atherosclerotic heart disease of native coronary artery without angina pectoris: Secondary | ICD-10-CM

## 2020-10-17 DIAGNOSIS — E785 Hyperlipidemia, unspecified: Secondary | ICD-10-CM | POA: Diagnosis not present

## 2020-10-17 DIAGNOSIS — I10 Essential (primary) hypertension: Secondary | ICD-10-CM | POA: Diagnosis not present

## 2020-10-17 DIAGNOSIS — Z7982 Long term (current) use of aspirin: Secondary | ICD-10-CM | POA: Diagnosis not present

## 2020-10-17 DIAGNOSIS — Z8249 Family history of ischemic heart disease and other diseases of the circulatory system: Secondary | ICD-10-CM | POA: Diagnosis not present

## 2020-10-17 DIAGNOSIS — Z833 Family history of diabetes mellitus: Secondary | ICD-10-CM | POA: Diagnosis not present

## 2020-10-17 DIAGNOSIS — Z87891 Personal history of nicotine dependence: Secondary | ICD-10-CM | POA: Diagnosis not present

## 2020-10-17 DIAGNOSIS — I2510 Atherosclerotic heart disease of native coronary artery without angina pectoris: Secondary | ICD-10-CM | POA: Diagnosis not present

## 2020-10-17 DIAGNOSIS — Z809 Family history of malignant neoplasm, unspecified: Secondary | ICD-10-CM | POA: Diagnosis not present

## 2020-12-04 ENCOUNTER — Other Ambulatory Visit: Payer: Self-pay | Admitting: Family Medicine

## 2020-12-04 DIAGNOSIS — I2510 Atherosclerotic heart disease of native coronary artery without angina pectoris: Secondary | ICD-10-CM

## 2020-12-17 ENCOUNTER — Other Ambulatory Visit: Payer: Self-pay

## 2020-12-17 ENCOUNTER — Telehealth (INDEPENDENT_AMBULATORY_CARE_PROVIDER_SITE_OTHER): Payer: Medicare HMO | Admitting: Family Medicine

## 2020-12-17 ENCOUNTER — Encounter: Payer: Self-pay | Admitting: Family Medicine

## 2020-12-17 DIAGNOSIS — U071 COVID-19: Secondary | ICD-10-CM

## 2020-12-17 MED ORDER — NIRMATRELVIR/RITONAVIR (PAXLOVID)TABLET: 3.0000 | ORAL_TABLET | Freq: Two times a day (BID) | ORAL | 0 refills | Status: AC

## 2020-12-17 NOTE — Progress Notes (Signed)
Chief Complaint  Patient presents with   Cough   Covid Positive    Tested positive today 12/17/20    [name] here for URI complaints. Due to COVID-19 pandemic, we are interacting via web portal for an electronic face-to-face visit. I verified patient's ID using 2 identifiers. Patient agreed to proceed with visit via this method. Patient is at home, I am at office. Patient and I are present for visit.   Duration: 2 days  Associated symptoms: subjective fever, sinus headache, sinus congestion, rhinorrhea, shortness of breath, myalgia, and cough, fatigue Denies: sinus pain, itchy watery eyes, ear pain, ear drainage, sore throat, wheezing, and N/V/D, loss of taste smell Treatment to date: Tylenol Sick contacts: Yes - exposed to coworkers Tested positive today.  3/4 vaccinated.   Past Medical History:  Diagnosis Date   Heart disease    Hypertension    Objective No conversational dyspnea Age appropriate judgment and insight Nml affect and mood  COVID-19 - Plan: nirmatrelvir/ritonavir EUA (PAXLOVID) TABS  5 days with the above.  GFR calculated is 83.  Continue to push fluids, practice good hand hygiene, cover mouth when coughing. F/u prn. If starting to experience replaceable fluid loss, shaking, or shortness of breath, seek immediate care. Pt voiced understanding and agreement to the plan.  [place], DO 12/17/20 3:27 PM

## 2021-02-09 DIAGNOSIS — H52222 Regular astigmatism, left eye: Secondary | ICD-10-CM | POA: Diagnosis not present

## 2021-02-09 DIAGNOSIS — H5203 Hypermetropia, bilateral: Secondary | ICD-10-CM | POA: Diagnosis not present

## 2021-02-09 DIAGNOSIS — H524 Presbyopia: Secondary | ICD-10-CM | POA: Diagnosis not present

## 2021-03-04 ENCOUNTER — Encounter: Payer: Self-pay | Admitting: Family Medicine

## 2021-03-04 ENCOUNTER — Other Ambulatory Visit: Payer: Self-pay

## 2021-03-04 ENCOUNTER — Ambulatory Visit (INDEPENDENT_AMBULATORY_CARE_PROVIDER_SITE_OTHER): Payer: Medicare HMO | Admitting: Family Medicine

## 2021-03-04 VITALS — BP 142/80 | HR 53 | Temp 98.0°F | Ht 74.0 in | Wt 221.4 lb

## 2021-03-04 DIAGNOSIS — I25118 Atherosclerotic heart disease of native coronary artery with other forms of angina pectoris: Secondary | ICD-10-CM

## 2021-03-04 DIAGNOSIS — Z23 Encounter for immunization: Secondary | ICD-10-CM | POA: Diagnosis not present

## 2021-03-04 DIAGNOSIS — Z0000 Encounter for general adult medical examination without abnormal findings: Secondary | ICD-10-CM

## 2021-03-04 DIAGNOSIS — Z136 Encounter for screening for cardiovascular disorders: Secondary | ICD-10-CM | POA: Diagnosis not present

## 2021-03-04 LAB — COMPREHENSIVE METABOLIC PANEL
ALT: 24 U/L (ref 0–53)
AST: 17 U/L (ref 0–37)
Albumin: 4.3 g/dL (ref 3.5–5.2)
Alkaline Phosphatase: 50 U/L (ref 39–117)
BUN: 18 mg/dL (ref 6–23)
CO2: 25 mEq/L (ref 19–32)
Calcium: 9.4 mg/dL (ref 8.4–10.5)
Chloride: 104 mEq/L (ref 96–112)
Creatinine, Ser: 0.93 mg/dL (ref 0.40–1.50)
GFR: 86.06 mL/min (ref >60.00)
Glucose, Bld: 104 mg/dL — ABNORMAL HIGH (ref 70–99)
Potassium: 4 mEq/L (ref 3.5–5.1)
Sodium: 138 mEq/L (ref 135–145)
Total Bilirubin: 1.1 mg/dL (ref 0.2–1.2)
Total Protein: 6.7 g/dL (ref 6.0–8.3)

## 2021-03-04 LAB — LIPID PANEL
Cholesterol: 158 mg/dL (ref 0–200)
HDL: 47.9 mg/dL (ref >39.00)
LDL Cholesterol: 93 mg/dL (ref 0–99)
NonHDL: 110.41
Total CHOL/HDL Ratio: 3
Triglycerides: 88 mg/dL (ref 0.0–149.0)
VLDL: 17.6 mg/dL (ref 0.0–40.0)

## 2021-03-04 NOTE — Progress Notes (Signed)
Chief Complaint  Patient presents with   Annual Exam    [name] is here for a complete physical.   His last physical was >1 year ago.  Current diet: in general, a "healthy" diet.   Current exercise: lifting weights, cardio Weight trend: down a few lbs Fatigue out of ordinary? No. Seat belt? Yes.    Health maintenance Shingrix- Yes Colonoscopy- Yes Tetanus- Yes Hep C- Yes Pneumonia vaccine- No AAA screening- No  Past Medical History:  Diagnosis Date   Heart disease    Hypertension      Past Surgical History:  Procedure Laterality Date   arthoscopic knee surgery     COLONOSCOPY     > 10 yrs.  It was normal   NO PAST SURGERIES      Medications  Current Outpatient Medications on File Prior to Visit  Medication Sig Dispense Refill   aspirin EC 81 MG tablet Take 81 mg by mouth daily.     carvedilol (COREG) 3.125 MG tablet TAKE 1 TABLET BY MOUTH TWICE DAILY WITH A MEAL 180 tablet 1   Colchicine (MITIGARE) 0.6 MG CAPS 2 caps now, 1 cap 1 hour later.  If symptoms persist, you may repeat the following day. 30 capsule 1   lisinopril (ZESTRIL) 20 MG tablet Take 1 tablet by mouth once daily 90 tablet 1   meloxicam (MOBIC) 7.5 MG tablet Take 1 tablet (7.5 mg total) by mouth daily as needed for pain. 30 tablet 1   rosuvastatin (CRESTOR) 5 MG tablet Take 1 tablet by mouth once daily 90 tablet 1    Allergies No Known Allergies  Family History Family History  Problem Relation Age of Onset   Arthritis Mother    Cancer Mother    Diabetes Mother    Heart disease Mother    Hypertension Mother    Colon cancer Mother 8   Alcohol abuse Father    Arthritis Father    Diabetes Father    Heart disease Father    Hypertension Father    Cancer Sister    Diabetes Sister    Hypertension Sister    Kidney disease Sister    Diabetes Brother    Hyperlipidemia Brother    Hypertension Brother    Colon polyps Neg Hx    Rectal cancer Neg Hx    Stomach cancer Neg Hx     Esophageal cancer Neg Hx     Review of Systems: Constitutional:  no fevers Eye:  no recent significant change in vision Ears:  No changes in hearing Nose/Mouth/Throat:  no complaints of nasal congestion, no sore throat Cardiovascular: no chest pain Respiratory:  No shortness of breath Gastrointestinal:  No change in bowel habits GU:  No frequency Integumentary:  no abnormal skin lesions reported Neurologic:  no headaches Endocrine:  denies unexplained weight changes  Exam BP (!) 142/80   Pulse (!) 53   Temp 98 F (36.7 C) (Oral)   Ht '6\' 2"'$  (1.88 m)   Wt 221 lb 6 oz (100.4 kg)   SpO2 97%   BMI 28.42 kg/m  General:  well developed, well nourished, in no apparent distress Skin:  no significant moles, warts, or growths Head:  no masses, lesions, or tenderness Eyes:  pupils equal and round, sclera anicteric without injection Ears:  canals without lesions, TMs shiny without retraction, no obvious effusion, no erythema Nose:  nares patent, septum midline, mucosa normal Throat/Pharynx:  lips and gingiva without lesion; tongue and uvula  midline; non-inflamed pharynx; no exudates or postnasal drainage Lungs:  clear to auscultation, breath sounds equal bilaterally, no respiratory distress Cardio:  regular rate and rhythm, no LE edema or bruits Rectal: Deferred GI: BS+, S, NT, ND, no masses or organomegaly Musculoskeletal:  symmetrical muscle groups noted without atrophy or deformity Neuro:  gait normal; deep tendon reflexes normal and symmetric Psych: well oriented with normal range of affect and appropriate judgment/insight  Assessment and Plan  Well adult exam  Coronary artery disease of native heart with stable angina pectoris, unspecified vessel or lesion type ([place]) - Plan: Comprehensive metabolic panel, Lipid panel  Screening for AAA (abdominal aortic aneurysm) - Plan: US AORTA MEDICARE SCREENING   Well 65 y.o. male. Counseled on diet and exercise. PCV20 today. Flu shot  rec'd for Oct.  AAA screening as above. Quit smoking 35 yrs ago, does not qualify for lung ca screening. Declines further covid boosters.  Other orders as above. Follow up in 6 mo.  The patient voiced understanding and agreement to the plan.  [place], DO 03/04/21 7:45 AM

## 2021-03-04 NOTE — Patient Instructions (Addendum)
Give [place] 2-3 business days to get the results of your labs back.   Keep the diet clean and stay active.  Someone will reach out regarding the ultrasound.  I recommend getting the flu shot in mid October. This suggestion would change if the CDC comes out with a different recommendation.   Let us know if you need anything.

## 2021-03-09 ENCOUNTER — Other Ambulatory Visit: Payer: Self-pay

## 2021-03-09 ENCOUNTER — Ambulatory Visit (HOSPITAL_BASED_OUTPATIENT_CLINIC_OR_DEPARTMENT_OTHER)
Admission: RE | Admit: 2021-03-09 | Discharge: 2021-03-09 | Disposition: A | Discharge status: Home / Self Care | Payer: Medicare HMO | Source: Ambulatory Visit | Attending: Family Medicine | Admitting: Family Medicine

## 2021-03-09 DIAGNOSIS — Z87891 Personal history of nicotine dependence: Secondary | ICD-10-CM | POA: Insufficient documentation

## 2021-03-09 DIAGNOSIS — Z136 Encounter for screening for cardiovascular disorders: Secondary | ICD-10-CM | POA: Diagnosis not present

## 2021-03-10 ENCOUNTER — Other Ambulatory Visit: Payer: Self-pay | Admitting: Family Medicine

## 2021-03-10 DIAGNOSIS — I10 Essential (primary) hypertension: Secondary | ICD-10-CM

## 2021-05-12 DIAGNOSIS — H52223 Regular astigmatism, bilateral: Secondary | ICD-10-CM | POA: Diagnosis not present

## 2021-05-12 DIAGNOSIS — H524 Presbyopia: Secondary | ICD-10-CM | POA: Diagnosis not present

## 2021-05-12 DIAGNOSIS — H5203 Hypermetropia, bilateral: Secondary | ICD-10-CM | POA: Diagnosis not present

## 2021-06-08 ENCOUNTER — Other Ambulatory Visit: Payer: Self-pay | Admitting: Family Medicine

## 2021-06-08 DIAGNOSIS — I2510 Atherosclerotic heart disease of native coronary artery without angina pectoris: Secondary | ICD-10-CM

## 2021-06-08 DIAGNOSIS — I10 Essential (primary) hypertension: Secondary | ICD-10-CM

## 2021-07-31 ENCOUNTER — Ambulatory Visit (INDEPENDENT_AMBULATORY_CARE_PROVIDER_SITE_OTHER): Payer: Medicare HMO | Admitting: Family Medicine

## 2021-07-31 ENCOUNTER — Other Ambulatory Visit (HOSPITAL_BASED_OUTPATIENT_CLINIC_OR_DEPARTMENT_OTHER): Payer: Self-pay

## 2021-07-31 ENCOUNTER — Encounter: Payer: Self-pay | Admitting: Family Medicine

## 2021-07-31 VITALS — BP 122/80 | HR 55 | Temp 97.9°F | Ht 74.0 in | Wt 216.5 lb

## 2021-07-31 DIAGNOSIS — K529 Noninfective gastroenteritis and colitis, unspecified: Secondary | ICD-10-CM

## 2021-07-31 MED ORDER — ONDANSETRON 4 MG PO TBDP
4.0000 mg | ORAL_TABLET | Freq: Three times a day (TID) | ORAL | 0 refills | Status: DC | PRN
  Filled 2021-07-31: qty 20, 7d supply, fill #0

## 2021-07-31 NOTE — Progress Notes (Signed)
Chief Complaint  Patient presents with   Follow-up     Subjective Logan Fox is a 66 y.o. male who presents with vomiting and diarrhea Symptoms began 4 d ago.  Patient has abdominal pain, nausea, cramping, diarrhea, and fever Patient denies vomiting, fever, arthralgias, myalgias, and URI symptoms Treatment to date: bland diet Sick contacts: none known  Past Medical History:  Diagnosis Date   Heart disease    Hypertension     Exam BP 122/80    Pulse (!) 55    Temp 97.9 F (36.6 C) (Oral)    Ht 6\' 2"  (1.88 m)    Wt 216 lb 8 oz (98.2 kg)    SpO2 97%    BMI 27.80 kg/m  General:  well developed, well hydrated, in no apparent distress Skin:  warm, no pallor or diaphoresis, no rashes Throat/Pharynx:  lips and gingiva without lesion; tongue and uvula midline; non-inflamed pharynx; no exudates or postnasal drainage Lungs:  clear to auscultation, breath sounds equal bilaterally, no respiratory distress, no wheezes Cardio:  RRR Abdomen:  abdomen soft, nontender; bowel sounds normal; no masses or organomegaly; neg Murphy's Psych: Appropriate judgement/insight  Assessment and Plan  Gastroenteritis - Plan: ondansetron (ZOFRAN-ODT) 4 MG disintegrating tablet, GI Profile, Stool, PCR, CANCELED: GI Profile, Stool, PCR  Stool culture if he still has diarrhea tomorrow. Zofran prn.  Avoid aggravating foods, discussed advancing diet. F/u if symptoms fail to improve, sooner if worsening. The patient voiced understanding and agreement to the plan.  [place], DO 07/31/21  12:40 PM

## 2021-07-31 NOTE — Patient Instructions (Signed)
Stay hydrated. Take something with electrolytes while you are having diarrhea.   If you are not improving by tomorrow, collect a stool sample.   Let us know if you need anything.

## 2021-09-01 ENCOUNTER — Encounter: Payer: Self-pay | Admitting: Family Medicine

## 2021-09-01 ENCOUNTER — Ambulatory Visit (INDEPENDENT_AMBULATORY_CARE_PROVIDER_SITE_OTHER): Payer: Medicare HMO | Admitting: Family Medicine

## 2021-09-01 DIAGNOSIS — I10 Essential (primary) hypertension: Secondary | ICD-10-CM | POA: Diagnosis not present

## 2021-09-01 DIAGNOSIS — I25118 Atherosclerotic heart disease of native coronary artery with other forms of angina pectoris: Secondary | ICD-10-CM

## 2021-09-01 MED ORDER — ROSUVASTATIN CALCIUM 5 MG PO TABS: 5.0000 mg | ORAL_TABLET | Freq: Every day | ORAL | 2 refills | Status: DC

## 2021-09-01 MED ORDER — LISINOPRIL 20 MG PO TABS: 20.0000 mg | ORAL_TABLET | Freq: Every day | ORAL | 2 refills | Status: DC

## 2021-09-01 MED ORDER — CARVEDILOL 3.125 MG PO TABS: 3.1250 mg | ORAL_TABLET | Freq: Two times a day (BID) | ORAL | 2 refills | Status: DC

## 2021-09-01 NOTE — Progress Notes (Signed)
Chief Complaint  ?Patient presents with  ? Follow-up  ?  6 month  ? ? ?Subjective ?Logan Fox is a 66 y.o. male who presents for hypertension follow up. ?He does not routinely monitor home blood pressures. ?He is compliant with medications. ?Patient has these side effects of medication: none ?He is adhering to a healthy diet overall. ?Current exercise: cycling, lifting wts, walking ?No Cp or SOB.  ? ?CAD ?Patient presents for CAD follow up. ?Currently being treated with Crestor 5 mg/d and aspirin and compliance now excellent. ?He denies myalgias. ?Diet/exercise as above.  ?  ?Past Medical History:  ?Diagnosis Date  ? Heart disease   ? Hypertension   ? ? ?Exam ?BP 112/76   Pulse (!) 51   Temp 97.7 ?F (36.5 ?C) (Oral)   Ht '6\' 2"'$  (1.88 m)   Wt 220 lb (99.8 kg)   SpO2 98%   BMI 28.25 kg/m?  ?General:  well developed, well nourished, in no apparent distress ?Heart: Reg rhythm, bradycardic, no bruits, no LE edema ?Lungs: clear to auscultation, no accessory muscle use ?Psych: well oriented with normal range of affect and appropriate judgment/insight ? ?Essential hypertension - Plan: lisinopril (ZESTRIL) 20 MG tablet, carvedilol (COREG) 3.125 MG tablet ? ?Coronary artery disease of native heart with stable angina pectoris, unspecified vessel or lesion type ([place]), Chronic - Plan: carvedilol (COREG) 3.125 MG tablet, rosuvastatin (CRESTOR) 5 MG tablet ? ?Chronic, stable. Cont lisinopril 20 mg/d, Coreg 3.125 mg bid. Counseled on diet and exercise. ?Chronic, stable. Cont Crestor 5 mg/d, ASA 81 mg/d.  ?F/u in 6 mo. ?The patient voiced understanding and agreement to the plan. ? ?Shelda Pal, DO ?09/01/21  ?7:47 AM ? ?

## 2021-09-01 NOTE — Patient Instructions (Signed)
Keep the diet clean and stay active. ? ?I am glad your knee is better. ? ?Let us know if you need anything. ?

## 2022-01-09 IMAGING — US US ABDOMINAL AORTA SCREENING AAA
1 series · 14 of 15 positions shown · non-contrast
Comparison: None.

CLINICAL DATA: Male between 65-75 years of age with a smoking
history.

EXAM:
US ABDOMINAL AORTA MEDICARE SCREENING
TECHNIQUE: Ultrasound examination of the abdominal aorta was performed as a
screening evaluation for abdominal aortic aneurysm.

[Series 1: us abdominal aorta screening aaa · 14 of 15 slices shown]
[im 1/15]
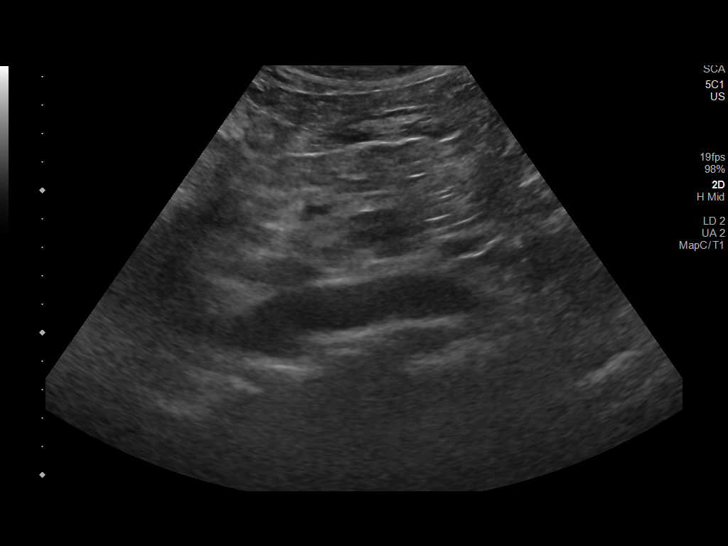
[im 2/15]
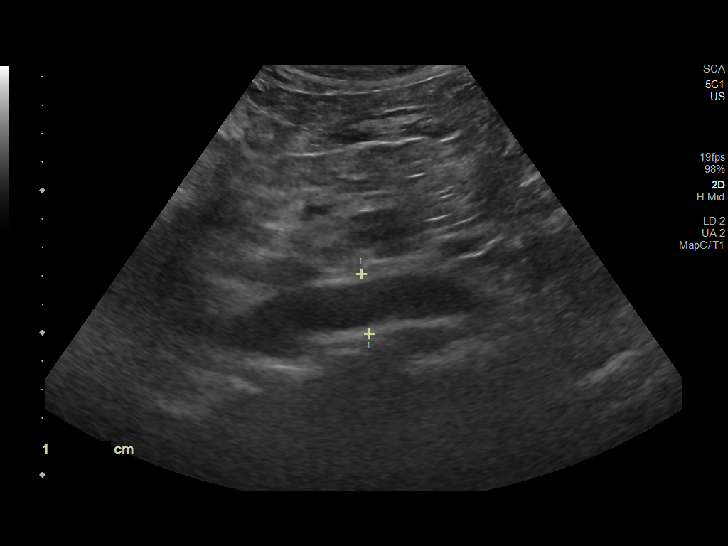
[im 3/15]
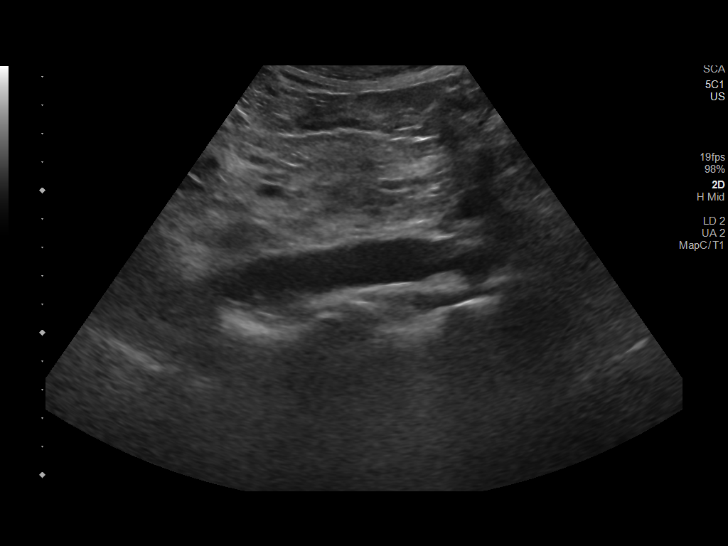
[im 4/15]
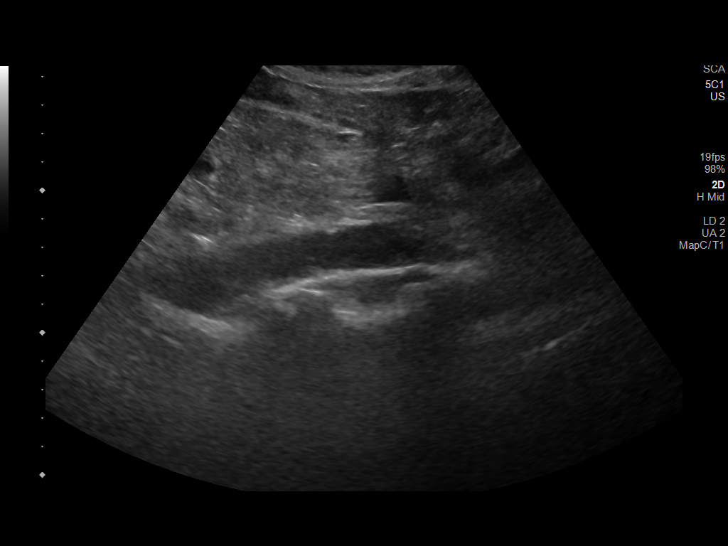
[im 5/15]
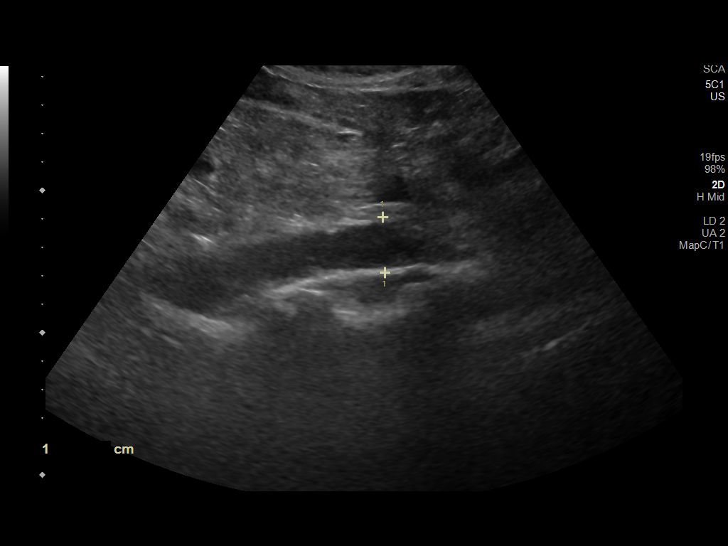
[im 6/15]
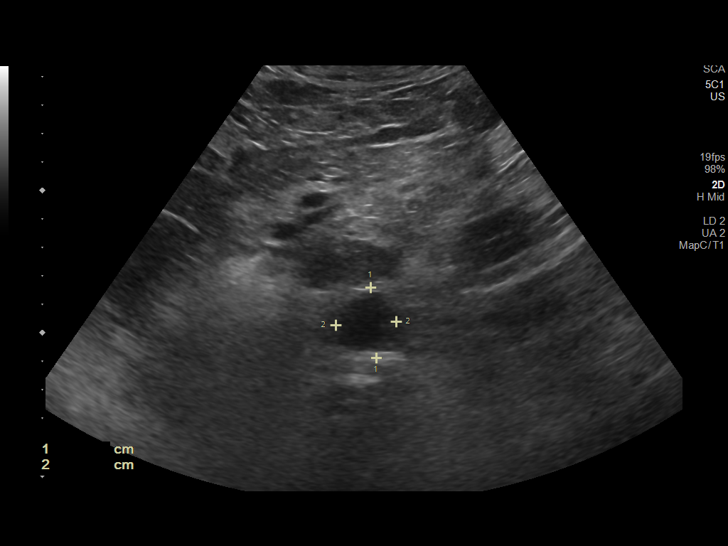
[im 7/15]
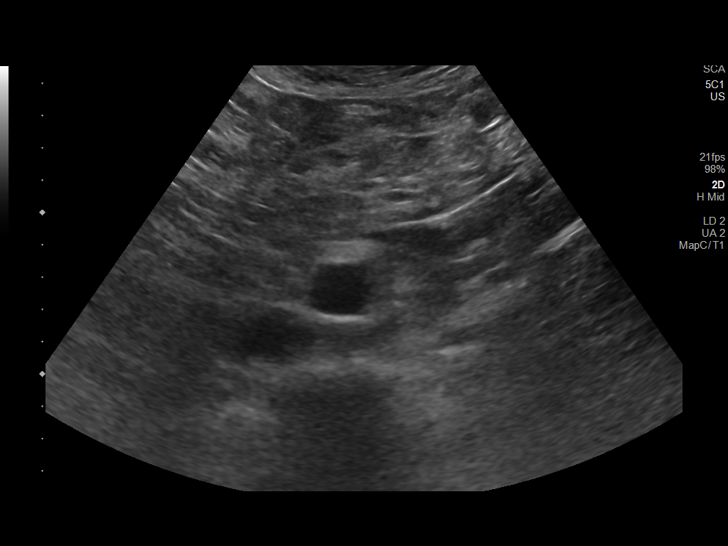
[im 9/15]
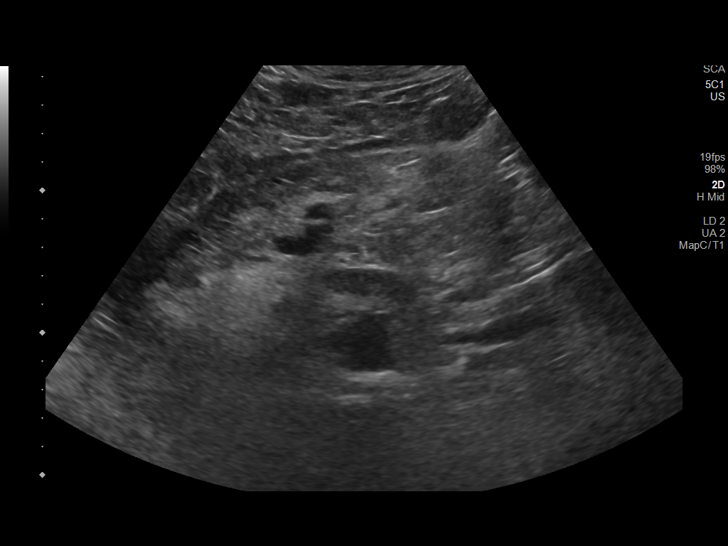
[im 10/15]
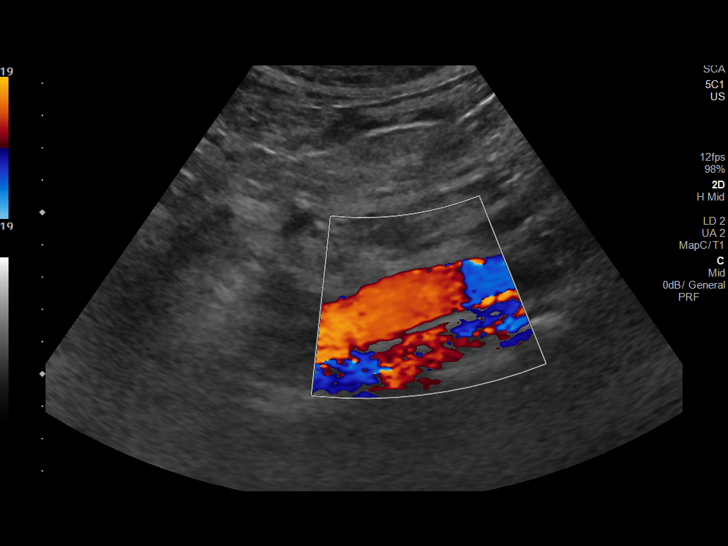
[im 11/15]
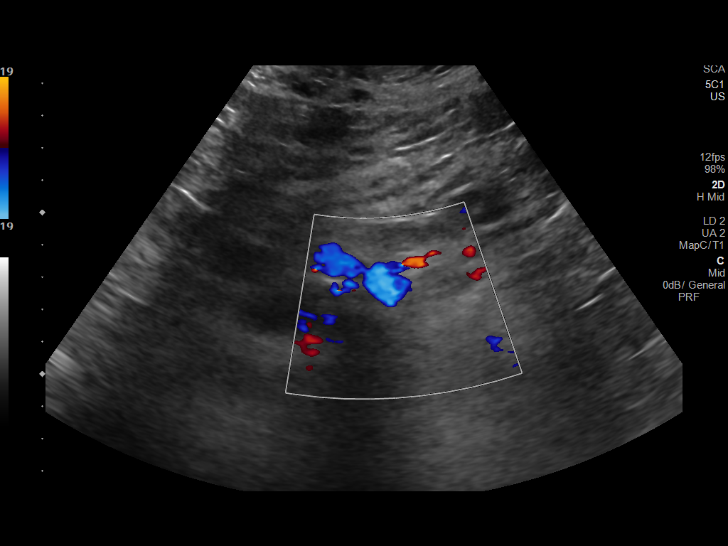
[im 12/15]
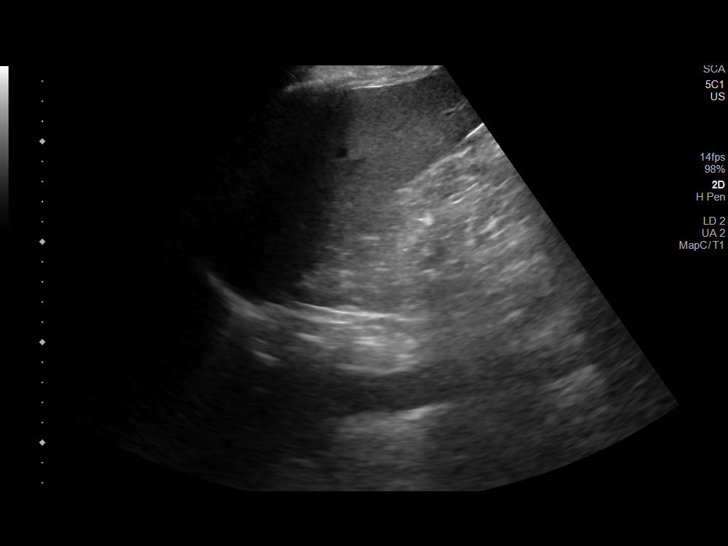
[im 13/15]
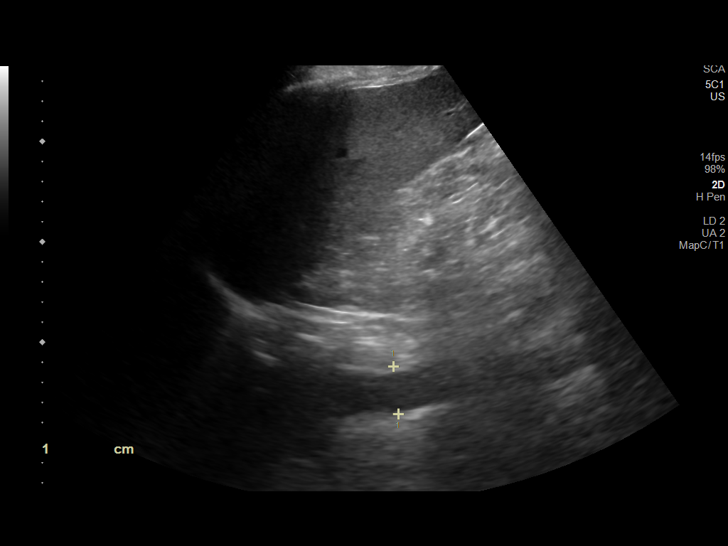
[im 14/15]
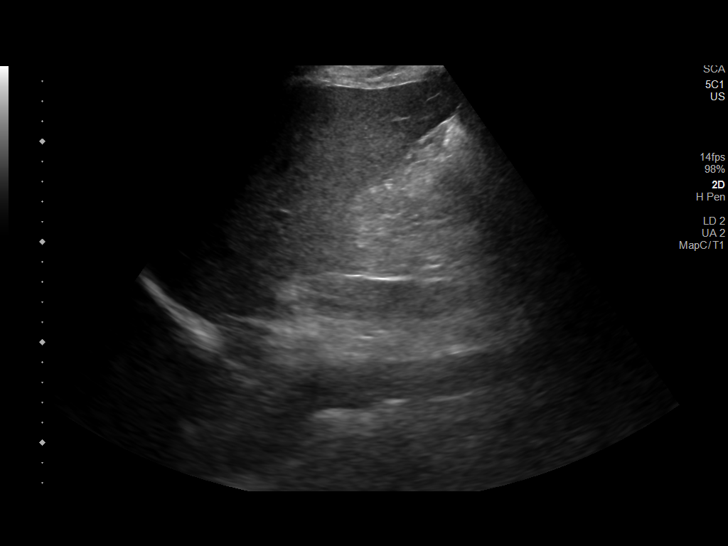
[im 15/15]
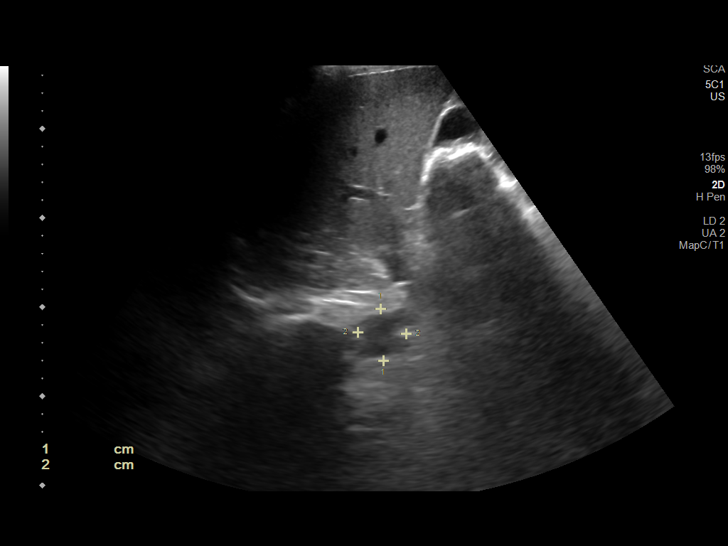

[14 of 15 positions shown; findings below may reference images not displayed]

FINDINGS: Abdominal aortic measurements as follows:

Proximal:  2.9 cm

Mid:  2.5 cm

Distal:  2.1 cm
IMPRESSION: No evidence of abdominal aortic aneurysm.

## 2022-02-05 ENCOUNTER — Encounter: Payer: Self-pay | Admitting: Family Medicine

## 2022-02-05 ENCOUNTER — Ambulatory Visit (INDEPENDENT_AMBULATORY_CARE_PROVIDER_SITE_OTHER): Payer: Medicare HMO | Admitting: Family Medicine

## 2022-02-05 VITALS — BP 128/80 | HR 59 | Temp 97.9°F | Ht 74.0 in | Wt 223.5 lb

## 2022-02-05 DIAGNOSIS — M5416 Radiculopathy, lumbar region: Secondary | ICD-10-CM

## 2022-02-05 MED ORDER — TIZANIDINE HCL 4 MG PO TABS: 4.0000 mg | ORAL_TABLET | Freq: Four times a day (QID) | ORAL | 0 refills | Status: DC | PRN

## 2022-02-05 MED ORDER — MELOXICAM 7.5 MG PO TABS: 7.5000 mg | ORAL_TABLET | Freq: Every day | ORAL | 0 refills | Status: DC

## 2022-02-05 NOTE — Patient Instructions (Addendum)
Heat (pad or rice pillow in microwave) over affected area, 10-15 minutes twice daily.   Ice/cold pack over area for 10-15 min twice daily.  OK to take Tylenol 1000 mg (2 extra strength tabs) or 975 mg (3 regular strength tabs) every 6 hours as needed.  If you do not hear anything about your referral in the next 1-2 weeks, call our office and ask for an update.  Someone will reach out regarding the MRI in the next week or so.   Let us know if you need anything.

## 2022-02-05 NOTE — Progress Notes (Signed)
Musculoskeletal Exam  Patient: Logan Fox DOB: 01/29/1956  DOS: 02/05/2022  SUBJECTIVE:  Chief Complaint:   Chief Complaint  Patient presents with   Leg Pain   Back Pain    Logan Fox is a 66 y.o.  male for evaluation and treatment of back pain.   Onset:  3 months ago. No inj or change in activity.  Location: lower R Character:  aching  Progression of issue:  has worsened slightly Associated symptoms: Numbness/tingling in RLE down to foot Denies bowel/bladder incontinence or weakness Treatment: to date has been rest, ice, OTC NSAIDS, home exercises, and heat.   Neurovascular symptoms: no  Past Medical History:  Diagnosis Date   Heart disease    Hypertension     Objective:  VITAL SIGNS: BP 128/80   Pulse (!) 59   Temp 97.9 F (36.6 C) (Oral)   Ht '6\' 2"'$  (1.88 m)   Wt 223 lb 8 oz (101.4 kg)   SpO2 96%   BMI 28.70 kg/m  Constitutional: Well formed, well developed. No acute distress. HENT: Normocephalic, atraumatic.  Thorax & Lungs:  No accessory muscle use Musculoskeletal: low back.   Tenderness to palpation: no Deformity: no Ecchymosis: no Straight leg test: negative for Poor hamstring flexibility b/l, worse on the right. Neurologic: Normal sensory function. No focal deficits noted. DTR's equal and symmetric in LE's. No clonus.  5/5 strength throughout the lower extremities. Psychiatric: Normal mood. Age appropriate judgment and insight. Alert & oriented x 3.    Assessment:  Lumbar radiculopathy - Plan: MR Lumbar Spine Wo Contrast, Ambulatory referral to Physical Therapy, meloxicam (MOBIC) 7.5 MG tablet, tiZANidine (ZANAFLEX) 4 MG tablet  Plan: Chronic, worsening. Stretches/exercises, heat, ice, Tylenol. Sparing use of Meloxicam. Trial Zanaflex prn as well. PT. MRI, given improvement w leaning forward, want to r/o spinal stenosis.  F/u as originally scheduled. The patient voiced understanding and agreement to the plan.   [place],  DO 02/05/22  10:38 AM

## 2022-02-11 NOTE — Therapy (Signed)
OUTPATIENT PHYSICAL THERAPY THORACOLUMBAR EVALUATION   Patient Name: Logan Fox MRN: [id number] DOB:10/09/1955, 66 y.o., male Today's Date: 02/15/2022   PT End of Session - 02/15/22 0846     Visit Number 1    Date for PT Re-Evaluation 04/12/22    Authorization Type [employer]    PT Start Time [phone]    PT Stop Time 0936    PT Time Calculation (min) 50 min    Activity Tolerance Patient tolerated treatment well    Behavior During Therapy [facility] for tasks assessed/performed             Past Medical History:  Diagnosis Date   Heart disease    Hypertension    Past Surgical History:  Procedure Laterality Date   arthoscopic knee surgery     COLONOSCOPY     > 10 yrs.  It was normal   Patient Active Problem List   Diagnosis Date Noted   Diastasis recti 08/29/2020   Chronic gout without tophus 08/11/2018   Essential hypertension 06/30/2017   Coronary artery disease of native heart with stable angina pectoris ([place]) 06/30/2017    PCP: Logan Pal, DO  REFERRING PROVIDER: Shelda Pal, DO  REFERRING DIAG: [phone] (ICD-10-CM) - Lumbar radiculopathy  RATIONALE FOR EVALUATION AND TREATMENT: Rehabilitation  THERAPY DIAG:  Radiculopathy, lumbar region - Plan: PT plan of care cert/re-cert  Other low back pain - Plan: PT plan of care cert/re-cert  Muscle spasm of back - Plan: PT plan of care cert/re-cert  Cramp and spasm - Plan: PT plan of care cert/re-cert  Other symptoms and signs involving the musculoskeletal system - Plan: PT plan of care cert/re-cert  ONSET DATE: ~3 months  NEXT MD VISIT: 03/09/22   SUBJECTIVE:                                                                                                                                                                                           SUBJECTIVE STATEMENT: Pt reports h/o intermittent LBP but for the last 3 months he reports he has been unable to shake the pain. He has seen a  Restaurant manager, fast food and worked with a Clinical research associate in the gym but unable to get relief this time. Some short-term benefit noted from use of inversion table at home. Feels like he may have a bulging disc and figures he needs a MRI but PT is a precursor. LBP is mostly midline at sacrum with intermittent numbness and tingling down R LE to the bottom of his foot. At times he feels like his R LE will not support him.  PAIN:  Are you having pain?  No and Yes: NPRS scale: 0/10 while sitting, 4/10 while walking, standing 6-7/10 Pain location: Midline low back at sacrum with intermittent cramping pain, numbness and tingling down R LE to bottom of foot Pain description: sharp stabbing in low back Aggravating factors: standing Relieving factors: sitting down and leaning forward  PERTINENT HISTORY: HTN, CAD, chronic gout, diastasis recti, R arthroscopic knee surgery - meniscal tear repair  PRECAUTIONS: None  WEIGHT BEARING RESTRICTIONS: No  FALLS:  Has patient fallen in last 6 months? 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  PLOF: Independent and Leisure: gym 2-3x/wk, yard work, cycling  PATIENT GOALS: "to get rid of the pain and be able to stand up w/o pain"   OBJECTIVE:   DIAGNOSTIC FINDINGS:  Lumbar MRI pending  PATIENT SURVEYS:  Modified Oswestry 11 / 50 = 22.0 % (moderate disability)  FOTO Lumbar = 69; predicted = 71  SCREENING FOR RED FLAGS: Bowel or bladder incontinence: No Spinal tumors: No Cauda equina syndrome: No Compression fracture: No Abdominal aneurysm: No  COGNITION:  Overall cognitive status: Within functional limits for tasks assessed     SENSATION: WFL Intermittent numbness and tingling in R LE  MUSCLE LENGTH: Hamstrings: mildly tight B ITB: mod/severe tight B Piriformis: mod  tight B Hip flexors: mod/severe tight B Quads: mod/severe tight R>L Heelcord: NT  POSTURE:  decreased lumbar lordosis  PALPATION: TTP in R glutes/piriformis  LUMBAR ROM:   Active  A/PROM  Eval - 02/15/22  Flexion WFL  Extension 50% limited  Right lateral flexion Hand to lateral knee  Left lateral flexion Hand to lateral knee  Right rotation WFL  Left rotation WFL   (Blank rows = not tested)  LOWER EXTREMITY ROM:    Limited R knee ROM following scope for menisectomy, otherwise limited due to LE tightness  LOWER EXTREMITY MMT:    MMT Right eval Left eval  Hip flexion 5 5  Hip extension 4+ 4+  Hip abduction 4 4+  Hip adduction 4+ 5  Hip internal rotation 5 5  Hip external rotation 4 4+  Knee flexion 5 5  Knee extension 5 5  Ankle dorsiflexion 5 5  Ankle plantarflexion 5 5  Ankle inversion    Ankle eversion     (Blank rows = not tested)  LUMBAR SPECIAL TESTS:  Straight leg raise test: Positive on right   TODAY'S TREATMENT:  02/15/22 THERAPEUTIC EXERCISE: Instruction in initial HEP (see below) to improve flexibility, strength and mobility.  Verbal and tactile cues throughout for technique.   PATIENT EDUCATION:  Education details: PT eval findings, anticipated POC, and initial HEP Person educated: Patient Education method: Explanation, Demonstration, Verbal cues, and Handouts Education comprehension: verbalized understanding, returned demonstration, verbal cues required, and needs further education   HOME EXERCISE PROGRAM: Access Code: [email or web address] URL: [email or web address] Date: 02/15/2022 Prepared by: Annie Paras  Exercises - Seated Table Piriformis Stretch  - 2-3 x daily - 7 x weekly - 3 reps - 30 sec hold - Seated Hip Flexor Stretch  - 2-3 x daily - 7 x weekly - 3 reps - 30 sec hold - Half Kneeling Hip Flexor Stretch  - 2-3 x daily - 7 x weekly - 3 reps - 30 sec hold - Standing ITB Stretch  - 2-3 x daily - 7 x weekly - 3 reps - 30  sec  hold - Supine Single Knee to Chest Stretch  - 2-3 x daily - 7 x weekly - 3 reps - 30 sec hold - Supine Bilateral Hip Internal Rotation Stretch  - 2-3 x daily - 7 x weekly - 3 reps - 30 sec hold  ASSESSMENT:  CLINICAL IMPRESSION: Logan Fox is a 66 y.o. male who was seen today for physical therapy evaluation and treatment for acute on chronic LBP with R LE radiculopathy.  He reports h/o intermittent LBP over the years which he has been able to manage with chiropractic visits, exercises with trainer and use of his inversion table, but current pain not as responsive. LBP and R LE radicular pain is intermittent, aggravated by prolonged standing or walking and relieved with sitting or flexion activities. Deficits include intermittent pain as previously described, mildly decreased lumbar ROM, significantly decreased proximal LE flexibility, increased muscle tension and TTP in R glutes/piriformis, and mildly decreased R>L hip strength. 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 Fredderick "Trip" will benefit from skilled PT to address above deficits to allow for increased activity tolerance with decreased pain interference.  OBJECTIVE IMPAIRMENTS: decreased activity tolerance, decreased knowledge of condition, decreased mobility, difficulty walking, decreased ROM, decreased strength, increased fascial restrictions, impaired perceived functional ability, increased muscle spasms, impaired flexibility, impaired sensation, improper body mechanics, postural dysfunction, and pain.   ACTIVITY LIMITATIONS: carrying, lifting, standing, stairs, and locomotion level  PARTICIPATION LIMITATIONS: community activity and occupation  PERSONAL FACTORS: Past/current experiences, Time since onset of injury/illness/exacerbation, and 3+ comorbidities: HTN, CAD, chronic gout, diastasis recti, R arthroscopic knee surgery - meniscal tear repair  are also affecting  patient's functional outcome.   REHAB POTENTIAL: Excellent  CLINICAL DECISION MAKING: Stable/uncomplicated  EVALUATION COMPLEXITY: Low   GOALS: Goals reviewed with patient? Yes  SHORT TERM GOALS: Target date: 03/15/2022   Patient will be independent with initial HEP to improve outcomes and carryover.  Baseline:  Goal status: INITIAL  2.  Patient will report centralization of radicular symptoms.  Baseline:  Goal status: INITIAL  LONG TERM GOALS: Target date: 04/12/2022    Patient will be independent with ongoing/advanced HEP for self-management at home.  Baseline:  Goal status: INITIAL  2.  Patient will report 75% improvement in low back pain and R LE radiculopathy to improve QOL.  Baseline:  Goal status: INITIAL  3.  Patient to demonstrate ability to achieve and maintain good spinal alignment/posturing and body mechanics needed for daily activities. Baseline:  Goal status: INITIAL  4.  Patient will demonstrate full pain free lumbar ROM to perform ADLs.   Baseline:  Goal status: INITIAL  5.  Patient will demonstrate improved B proximal LE strength to >/= 4+/5 for improved stability and ease of mobility . Baseline:  Goal status: INITIAL  6.  Patient will report 53 on lumbar FOTO to demonstrate improved functional ability.  Baseline: 69 Goal status: INITIAL   7.  Patient will report improve modified Oswestry to </= 5/50 or 10% (minimal disability) to demonstrate improved functional ability Baseline: 11/50 = 22% (moderate disability) Goal status: INITIAL  8.  Patient will tolerate 20-30 min of standing or walking to allow for performance of ADLs and daily tasks w/o pain interference. Baseline: <10 minutes Goal status: INITIAL    PLAN: PT FREQUENCY: 1-2x/week  PT DURATION: 6-8 weeks  PLANNED INTERVENTIONS: Therapeutic exercises, Therapeutic activity, Neuromuscular re-education, Balance training, Gait training, Patient/Family education, Self Care, Joint  mobilization, Dry Needling, Electrical stimulation, Spinal mobilization, Cryotherapy, Moist heat, Taping, Traction, Ultrasound, Manual therapy, and Re-evaluation.  PLAN FOR NEXT SESSION: Review initial HEP; progress lumbopelvic flexibility and initiate lumbopelvic strengthening; MT +/- DN to address increased muscle tension and TTP; possible mechanical traction as benefit noted from use of inversion table at home   Percival Spanish, PT 02/15/2022, 12:22 PM

## 2022-02-15 ENCOUNTER — Encounter: Payer: Self-pay | Admitting: Physical Therapy

## 2022-02-15 ENCOUNTER — Other Ambulatory Visit: Payer: Self-pay

## 2022-02-15 ENCOUNTER — Ambulatory Visit: Payer: Medicare HMO | Attending: Family Medicine | Admitting: Physical Therapy

## 2022-02-15 DIAGNOSIS — R252 Cramp and spasm: Secondary | ICD-10-CM | POA: Insufficient documentation

## 2022-02-15 DIAGNOSIS — M5459 Other low back pain: Secondary | ICD-10-CM | POA: Diagnosis not present

## 2022-02-15 DIAGNOSIS — M5416 Radiculopathy, lumbar region: Secondary | ICD-10-CM | POA: Insufficient documentation

## 2022-02-15 DIAGNOSIS — R29898 Other symptoms and signs involving the musculoskeletal system: Secondary | ICD-10-CM | POA: Diagnosis not present

## 2022-02-15 DIAGNOSIS — M62830 Muscle spasm of back: Secondary | ICD-10-CM | POA: Insufficient documentation

## 2022-02-23 ENCOUNTER — Ambulatory Visit: Payer: Medicare HMO

## 2022-02-23 DIAGNOSIS — M62830 Muscle spasm of back: Secondary | ICD-10-CM

## 2022-02-23 DIAGNOSIS — M5459 Other low back pain: Secondary | ICD-10-CM | POA: Diagnosis not present

## 2022-02-23 DIAGNOSIS — R252 Cramp and spasm: Secondary | ICD-10-CM | POA: Diagnosis not present

## 2022-02-23 DIAGNOSIS — R29898 Other symptoms and signs involving the musculoskeletal system: Secondary | ICD-10-CM

## 2022-02-23 DIAGNOSIS — M5416 Radiculopathy, lumbar region: Secondary | ICD-10-CM | POA: Diagnosis not present

## 2022-02-23 NOTE — Therapy (Signed)
OUTPATIENT PHYSICAL THERAPY TREATMENT   Patient Name: Logan Fox MRN: [id number] DOB:Sep 02, 1955, 66 y.o., male Today's Date: 02/23/2022   PT End of Session - 02/23/22 1618     Visit Number 2    Date for PT Re-Evaluation 04/12/22    Authorization Type [employer] Medicare    PT Start Time 4098    PT Stop Time 1191    PT Time Calculation (min) 41 min    Activity Tolerance Patient tolerated treatment well    Behavior During Therapy [facility] for tasks assessed/performed              Past Medical History:  Diagnosis Date   Heart disease    Hypertension    Past Surgical History:  Procedure Laterality Date   arthoscopic knee surgery     COLONOSCOPY     > 10 yrs.  It was normal   Patient Active Problem List   Diagnosis Date Noted   Diastasis recti 08/29/2020   Chronic gout without tophus 08/11/2018   Essential hypertension 06/30/2017   Coronary artery disease of native heart with stable angina pectoris ([place]) 06/30/2017    PCP: Shelda Pal, DO  REFERRING PROVIDER: Shelda Pal, DO  REFERRING DIAG: [phone] (ICD-10-CM) - Lumbar radiculopathy  RATIONALE FOR EVALUATION AND TREATMENT: Rehabilitation  THERAPY DIAG:  Radiculopathy, lumbar region  Other low back pain  Muscle spasm of back  Cramp and spasm  Other symptoms and signs involving the musculoskeletal system  ONSET DATE: ~3 months  NEXT MD VISIT: 03/09/22   SUBJECTIVE:                                                                                                                                                                                           SUBJECTIVE STATEMENT: Pt notes improvement in LBP with exercises given at eval. Some exercises are "brutal". No pain at the moment.  PAIN:  Are you having pain? No and Yes: NPRS scale: 0/10 while sitting, 4/10 while walking, standing 6-7/10 Pain location: Midline low back at sacrum with intermittent cramping pain, numbness and tingling  down R LE to bottom of foot Pain description: sharp stabbing in low back Aggravating factors: standing Relieving factors: sitting down and leaning forward  PERTINENT HISTORY: HTN, CAD, chronic gout, diastasis recti, R arthroscopic knee surgery - meniscal tear repair  PRECAUTIONS: None  WEIGHT BEARING RESTRICTIONS: No  FALLS:  Has patient fallen in last 6 months? 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  PLOF: Independent and Leisure: gym 2-3x/wk, yard work, cycling  PATIENT GOALS: "to get rid of the pain and be able to stand up w/o pain"   OBJECTIVE:   DIAGNOSTIC FINDINGS:  Lumbar MRI pending  PATIENT SURVEYS:  Modified Oswestry 11 / 50 = 22.0 % (moderate disability)  FOTO Lumbar = 69; predicted = 71  SCREENING FOR RED FLAGS: Bowel or bladder incontinence: No Spinal tumors: No Cauda equina syndrome: No Compression fracture: No Abdominal aneurysm: No  COGNITION:  Overall cognitive status: Within functional limits for tasks assessed     SENSATION: WFL Intermittent numbness and tingling in R LE  MUSCLE LENGTH: Hamstrings: mildly tight B ITB: mod/severe tight B Piriformis: mod tight B Hip flexors: mod/severe tight B Quads: mod/severe tight R>L Heelcord: NT  POSTURE:  decreased lumbar lordosis  PALPATION: TTP in R glutes/piriformis  LUMBAR ROM:   Active  A/PROM  Eval - 02/15/22  Flexion WFL  Extension 50% limited  Right lateral flexion Hand to lateral knee  Left lateral flexion Hand to lateral knee  Right rotation WFL  Left rotation WFL   (Blank rows = not tested)  LOWER EXTREMITY ROM:    Limited R knee ROM following scope for menisectomy, otherwise limited due to LE tightness  LOWER EXTREMITY MMT:    MMT Right eval Left eval  Hip flexion 5 5  Hip  extension 4+ 4+  Hip abduction 4 4+  Hip adduction 4+ 5  Hip internal rotation 5 5  Hip external rotation 4 4+  Knee flexion 5 5  Knee extension 5 5  Ankle dorsiflexion 5 5  Ankle plantarflexion 5 5  Ankle inversion    Ankle eversion     (Blank rows = not tested)  LUMBAR SPECIAL TESTS:  Straight leg raise test: Positive on right   TODAY'S TREATMENT: 02/23/22 Therapeutic Exercise: Nustep L3x38mn Standing ITB stretch x 30 sec each  Half kneeling hip flexor stretch on BOSU x 30 sec bil Supine SKTC x 30 sec bil Supine IR stretch x 30 sec bil Supine bridge 10x with 5 sec hold Bridge with clamshell x 10 RTB Sidelying clam RTB 10x 5 sec hold bil - cues to avoid tilting back   02/15/22 THERAPEUTIC EXERCISE: Instruction in initial HEP (see below) to improve flexibility, strength and mobility.  Verbal and tactile cues throughout for technique.   PATIENT EDUCATION:  Education details: HEP update - 8/29 Person educated: Patient Education method: Explanation, Demonstration, Verbal cues, and Handouts Education comprehension: verbalized understanding, returned demonstration, verbal cues required, and needs further education   HOME EXERCISE PROGRAM: Access Code: [email or web address]: [email or web address] Date: 02/23/2022 Prepared by: BClarene Essex Exercises - Seated Table Piriformis Stretch  - 2-3 x daily - 7 x weekly - 3 reps - 30 sec hold - Seated Hip Flexor Stretch  - 2-3 x daily - 7 x weekly - 3 reps - 30 sec hold - Half Kneeling Hip Flexor Stretch  - 2-3 x daily - 7 x weekly - 3 reps - 30 sec hold - Standing ITB Stretch  - 2-3 x daily - 7 x weekly - 3 reps - 30 sec hold - Supine Single Knee to Chest Stretch  - 2-3 x daily - 7 x weekly - 3 reps - 30 sec hold - Supine Bilateral Hip Internal Rotation Stretch  - 2-3 x daily - 7 x weekly - 3 reps - 30 sec hold - Bridge with Hip Abduction and Resistance  - 1  x daily - 3 x weekly - 3 sets - 10 reps - 5 sec hold - Clam with  Resistance  - 1 x daily - 3 x weekly - 3 sets - 10 reps - 5 sec hold  ASSESSMENT:  CLINICAL IMPRESSION: Pt demonstrated a good tolerance of the progressed exercises. Pt had no pain with any exercises but did have reports of numbness when in hooklying which required frequent position changes to sitting. Cues with sidelying clams to avoid tilting back to isolate glute med. Updated HEP to initiate lumbopelvic strengthening with instruction provided. Pt would benefit from continued progression of strengthening to tolerance.  OBJECTIVE IMPAIRMENTS: decreased activity tolerance, decreased knowledge of condition, decreased mobility, difficulty walking, decreased ROM, decreased strength, increased fascial restrictions, impaired perceived functional ability, increased muscle spasms, impaired flexibility, impaired sensation, improper body mechanics, postural dysfunction, and pain.   ACTIVITY LIMITATIONS: carrying, lifting, standing, stairs, and locomotion level  PARTICIPATION LIMITATIONS: community activity and occupation  PERSONAL FACTORS: Past/current experiences, Time since onset of injury/illness/exacerbation, and 3+ comorbidities: HTN, CAD, chronic gout, diastasis recti, R arthroscopic knee surgery - meniscal tear repair  are also affecting patient's functional outcome.   REHAB POTENTIAL: Excellent  CLINICAL DECISION MAKING: Stable/uncomplicated  EVALUATION COMPLEXITY: Low   GOALS: Goals reviewed with patient? Yes  SHORT TERM GOALS: Target date: 03/15/2022   Patient will be independent with initial HEP to improve outcomes and carryover.  Baseline:  Goal status: IN PROGRESS  2.  Patient will report centralization of radicular symptoms.  Baseline:  Goal status: IN PROGRESS  LONG TERM GOALS: Target date: 04/12/2022    Patient will be independent with ongoing/advanced HEP for self-management at home.  Baseline:  Goal status: IN PROGRESS  2.  Patient will report 75% improvement in low  back pain and R LE radiculopathy to improve QOL.  Baseline:  Goal status: IN PROGRESS  3.  Patient to demonstrate ability to achieve and maintain good spinal alignment/posturing and body mechanics needed for daily activities. Baseline:  Goal status: IN PROGRESS  4.  Patient will demonstrate full pain free lumbar ROM to perform ADLs.   Baseline:  Goal status: IN PROGRESS  5.  Patient will demonstrate improved B proximal LE strength to >/= 4+/5 for improved stability and ease of mobility . Baseline:  Goal status: IN PROGRESS  6.  Patient will report 73 on lumbar FOTO to demonstrate improved functional ability.  Baseline: 69 Goal status: IN PROGRESS   7.  Patient will report improve modified Oswestry to </= 5/50 or 10% (minimal disability) to demonstrate improved functional ability Baseline: 11/50 = 22% (moderate disability) Goal status: IN PROGRESS  8.  Patient will tolerate 20-30 min of standing or walking to allow for performance of ADLs and daily tasks w/o pain interference. Baseline: <10 minutes Goal status: IN PROGRESS    PLAN: PT FREQUENCY: 1-2x/week  PT DURATION: 6-8 weeks  PLANNED INTERVENTIONS: Therapeutic exercises, Therapeutic activity, Neuromuscular re-education, Balance training, Gait training, Patient/Family education, Self Care, Joint mobilization, Dry Needling, Electrical stimulation, Spinal mobilization, Cryotherapy, Moist heat, Taping, Traction, Ultrasound, Manual therapy, and Re-evaluation.  PLAN FOR NEXT SESSION:  progress lumbopelvic flexibility and initiate lumbopelvic strengthening; MT +/- DN to address increased muscle tension and TTP; possible mechanical traction as benefit noted from use of inversion table at home   Artist Pais, PTA 02/23/2022, 4:18 PM

## 2022-02-28 ENCOUNTER — Other Ambulatory Visit: Payer: Self-pay | Admitting: Family Medicine

## 2022-02-28 DIAGNOSIS — M5416 Radiculopathy, lumbar region: Secondary | ICD-10-CM

## 2022-03-02 ENCOUNTER — Ambulatory Visit: Payer: Medicare HMO | Attending: Family Medicine

## 2022-03-02 DIAGNOSIS — M5416 Radiculopathy, lumbar region: Secondary | ICD-10-CM | POA: Diagnosis not present

## 2022-03-02 DIAGNOSIS — M62830 Muscle spasm of back: Secondary | ICD-10-CM | POA: Diagnosis not present

## 2022-03-02 DIAGNOSIS — R29898 Other symptoms and signs involving the musculoskeletal system: Secondary | ICD-10-CM | POA: Insufficient documentation

## 2022-03-02 DIAGNOSIS — M5459 Other low back pain: Secondary | ICD-10-CM | POA: Diagnosis not present

## 2022-03-02 DIAGNOSIS — R252 Cramp and spasm: Secondary | ICD-10-CM | POA: Insufficient documentation

## 2022-03-02 NOTE — Therapy (Addendum)
OUTPATIENT PHYSICAL THERAPY TREATMENT / DISCHARGE SUMMARY   Patient Name: Logan Fox MRN: [id number] DOB:03/26/1956, 66 y.o., male Today's Date: 03/02/2022   PT End of Session - 03/02/22 0805     Visit Number 3    Date for PT Re-Evaluation 04/12/22    Authorization Type [employer]    PT Start Time 0800    PT Stop Time 0843    PT Time Calculation (min) 43 min    Activity Tolerance Patient tolerated treatment well    Behavior During Therapy [facility] for tasks assessed/performed               Past Medical History:  Diagnosis Date   Heart disease    Hypertension    Past Surgical History:  Procedure Laterality Date   arthoscopic knee surgery     COLONOSCOPY     > 10 yrs.  It was normal   Patient Active Problem List   Diagnosis Date Noted   Diastasis recti 08/29/2020   Chronic gout without tophus 08/11/2018   Essential hypertension 06/30/2017   Coronary artery disease of native heart with stable angina pectoris ([place]) 06/30/2017    PCP: Shelda Pal, DO  REFERRING PROVIDER: Shelda Pal, DO  REFERRING DIAG: [phone] (ICD-10-CM) - Lumbar radiculopathy  RATIONALE FOR EVALUATION AND TREATMENT: Rehabilitation  THERAPY DIAG:  Radiculopathy, lumbar region  Other low back pain  Muscle spasm of back  Cramp and spasm  Other symptoms and signs involving the musculoskeletal system  ONSET DATE: ~3 months  NEXT MD VISIT: 03/09/22   SUBJECTIVE:                                                                                                                                                                                           SUBJECTIVE STATEMENT: Pt reports that the bridges seem to aggravate the back. Now feels that his pain is traveling to the left side as well.  PAIN:  Are you having pain? No and Yes: NPRS scale: 6-7/10 Pain location: Midline low back at sacrum with intermittent cramping pain, numbness and tingling down R LE to bottom of  foot Pain description: sharp stabbing in low back Aggravating factors: standing Relieving factors: sitting down and leaning forward  PERTINENT HISTORY: HTN, CAD, chronic gout, diastasis recti, R arthroscopic knee surgery - meniscal tear repair  PRECAUTIONS: None  WEIGHT BEARING RESTRICTIONS: No  FALLS:  Has patient fallen in last 6 months? 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  PLOF: Independent and Leisure: gym 2-3x/wk, yard work, cycling  PATIENT GOALS: "to get rid of the pain and be able to stand up w/o pain"   OBJECTIVE:   DIAGNOSTIC FINDINGS:  Lumbar MRI pending  PATIENT SURVEYS:  Modified Oswestry 11 / 50 = 22.0 % (moderate disability)  FOTO Lumbar = 69; predicted = 71  SCREENING FOR RED FLAGS: Bowel or bladder incontinence: No Spinal tumors: No Cauda equina syndrome: No Compression fracture: No Abdominal aneurysm: No  COGNITION:  Overall cognitive status: Within functional limits for tasks assessed     SENSATION: WFL Intermittent numbness and tingling in R LE  MUSCLE LENGTH: Hamstrings: mildly tight B ITB: mod/severe tight B Piriformis: mod tight B Hip flexors: mod/severe tight B Quads: mod/severe tight R>L Heelcord: NT  POSTURE:  decreased lumbar lordosis  PALPATION: TTP in R glutes/piriformis  LUMBAR ROM:   Active  A/PROM  Eval - 02/15/22  Flexion WFL  Extension 50% limited  Right lateral flexion Hand to lateral knee  Left lateral flexion Hand to lateral knee  Right rotation WFL  Left rotation WFL   (Blank rows = not tested)  LOWER EXTREMITY ROM:    Limited R knee ROM following scope for menisectomy, otherwise limited due to LE tightness  LOWER EXTREMITY MMT:    MMT Right eval Left eval  Hip flexion 5 5  Hip extension 4+ 4+  Hip  abduction 4 4+  Hip adduction 4+ 5  Hip internal rotation 5 5  Hip external rotation 4 4+  Knee flexion 5 5  Knee extension 5 5  Ankle dorsiflexion 5 5  Ankle plantarflexion 5 5  Ankle inversion    Ankle eversion     (Blank rows = not tested)  LUMBAR SPECIAL TESTS:  Straight leg raise test: Positive on right   TODAY'S TREATMENT: 03/02/22 Therapeutic Exercise: Recumbent Bike L3x76mn Supine figure 4 stretch bil x 30 sec each Supine KTOS piriformis stretch 3x30 sec bil Supine LTR 3x10"  Supine sciatic nerve glide 10x Standing lumbar extension 10x Supine bridges x 10   Manual Therapy: STM to R glutes and piriformis TPR to R piriformis  02/23/22 Therapeutic Exercise: Nustep L3x661m Standing ITB stretch x 30 sec each  Half kneeling hip flexor stretch on BOSU x 30 sec bil Supine SKTC x 30 sec bil Supine IR stretch x 30 sec bil Supine bridge 10x with 5 sec hold Bridge with clamshell x 10 RTB Sidelying clam RTB 10x 5 sec hold bil - cues to avoid tilting back   02/15/22 THERAPEUTIC EXERCISE: Instruction in initial HEP (see below) to improve flexibility, strength and mobility.  Verbal and tactile cues throughout for technique.   PATIENT EDUCATION:  Education details: HEP update - 9/5 Person educated: Patient Education method: Explanation, Demonstration, Verbal cues, and Handouts Education comprehension: verbalized understanding, returned demonstration, verbal cues required, and needs further education   HOME EXERCISE PROGRAM: Access Code: [email or web address]: [email or web address] Date: 03/02/2022 Prepared by: BrClarene EssexExercises - Seated Table Piriformis Stretch  - 2-3 x daily - 7 x weekly - 3 reps - 30 sec hold - Seated Hip Flexor Stretch  - 2-3 x daily - 7 x weekly - 3 reps - 30 sec hold - Half Kneeling Hip Flexor Stretch  - 2-3 x daily - 7 x weekly - 3 reps - 30 sec hold - Standing ITB Stretch  - 2-3 x daily - 7 x weekly - 3 reps - 30 sec hold -  Supine Single Knee to Chest Stretch  - 2-3 x daily - 7 x weekly - 3 reps - 30 sec hold - Supine Bilateral Hip Internal Rotation Stretch  - 2-3 x daily - 7 x weekly - 3 reps - 30 sec hold - Bridge with Hip Abduction and Resistance  - 1 x daily - 3 x weekly - 3 sets - 10 reps - 5 sec hold - Clam with Resistance  - 1 x daily - 3 x weekly - 3 sets - 10 reps - 5 sec hold - Supine Piriformis Stretch with Foot on Ground  - 2 x daily - 7 x weekly - 2 sets - 30 sec hold - Seated Hamstring Stretch  - 2 x daily - 7 x weekly - 2 sets - 30 sec hold  ASSESSMENT:  CLINICAL IMPRESSION: Pt came in frustrated reporting no improvement in radicular pain so far. He noted that the bridges were aggravating to his lower back. MT was done to decrease muscle tension and trigger points in R piriformis. By the end of session he was able to do a bridge w/o any aggravation to the lower back. TE focused on glute/hip flexibility to improve radicular symptoms. Pt responded well to treatment.  OBJECTIVE IMPAIRMENTS: decreased activity tolerance, decreased knowledge of condition, decreased mobility, difficulty walking, decreased ROM, decreased strength, increased fascial restrictions, impaired perceived functional ability, increased muscle spasms, impaired flexibility, impaired sensation, improper body mechanics, postural dysfunction, and pain.   ACTIVITY LIMITATIONS: carrying, lifting, standing, stairs, and locomotion level  PARTICIPATION LIMITATIONS: community activity and occupation  PERSONAL FACTORS: Past/current experiences, Time since onset of injury/illness/exacerbation, and 3+ comorbidities: HTN, CAD, chronic gout, diastasis recti, R arthroscopic knee surgery - meniscal tear repair  are also affecting patient's functional outcome.   REHAB POTENTIAL: Excellent  CLINICAL DECISION MAKING: Stable/uncomplicated  EVALUATION COMPLEXITY: Low   GOALS: Goals reviewed with patient? Yes  SHORT TERM GOALS: Target date:  03/15/2022   Patient will be independent with initial HEP to improve outcomes and carryover.  Baseline:  Goal status: IN PROGRESS  2.  Patient will report centralization of radicular symptoms.  Baseline:  Goal status: IN PROGRESS - 9/5 (still c/o N/T down R leg)  LONG TERM GOALS: Target date: 04/12/2022    Patient will be independent with ongoing/advanced HEP for self-management at home.  Baseline:  Goal status: IN PROGRESS  2.  Patient will report 75% improvement in low back pain and R LE radiculopathy to improve QOL.  Baseline:  Goal status: IN PROGRESS  3.  Patient to demonstrate ability to achieve and maintain good spinal alignment/posturing and body mechanics needed for daily activities. Baseline:  Goal status: IN PROGRESS  4.  Patient will demonstrate full pain free lumbar ROM to perform ADLs.   Baseline:  Goal status: IN PROGRESS  5.  Patient will demonstrate improved B proximal LE strength to >/= 4+/5 for improved stability and ease of mobility . Baseline:  Goal status: IN PROGRESS  6.  Patient will report 36 on lumbar FOTO to demonstrate improved functional ability.  Baseline: 69 Goal status: IN PROGRESS   7.  Patient will report improve modified Oswestry to </= 5/50 or 10% (minimal disability) to demonstrate improved functional ability Baseline: 11/50 = 22% (moderate disability) Goal status: IN PROGRESS  8.  Patient will tolerate 20-30 min of standing or walking to allow for performance of ADLs and daily tasks w/o pain interference. Baseline: <10 minutes Goal status: IN PROGRESS    PLAN: PT  FREQUENCY: 1-2x/week  PT DURATION: 6-8 weeks  PLANNED INTERVENTIONS: Therapeutic exercises, Therapeutic activity, Neuromuscular re-education, Balance training, Gait training, Patient/Family education, Self Care, Joint mobilization, Dry Needling, Electrical stimulation, Spinal mobilization, Cryotherapy, Moist heat, Taping, Traction, Ultrasound, Manual therapy, and  Re-evaluation.  PLAN FOR NEXT SESSION:  MT +/- DN to address increased muscle tension and TTP; progress lumbopelvic flexibility and initiate lumbopelvic strengthening; possible mechanical traction as benefit noted from use of inversion table at home   Artist Pais, PTA 03/02/2022, 8:43 AM   PHYSICAL THERAPY DISCHARGE SUMMARY  Visits from Start of Care: 3  Current functional level related to goals / functional outcomes:   Refer to above clinical impression and goal assessment for status as of last visit on 03/02/2022. As of 03/09/22, patient called to cancel all remaining visits, requesting to be placed on a 30-day hold.  He has not returned to physical therapy in greater than 30 days, therefore will proceed with discharge from PT for this episode.     Remaining deficits:   As above. Unable to formally assess status at discharge due to failure to return to PT.   Education / Equipment:   HEP   Patient agrees to discharge. Patient goals were not met. Patient is being discharged due to the patient's request.  Percival Spanish, PT, MPT 05/04/22, 4:00 PM  [facility] [address]  [place], [place], [address] Phone: [phone]   Fax:  [phone]

## 2022-03-09 ENCOUNTER — Ambulatory Visit (INDEPENDENT_AMBULATORY_CARE_PROVIDER_SITE_OTHER): Payer: Medicare HMO | Admitting: Family Medicine

## 2022-03-09 ENCOUNTER — Encounter: Payer: Self-pay | Admitting: Family Medicine

## 2022-03-09 ENCOUNTER — Encounter: Payer: Medicare HMO | Admitting: Physical Therapy

## 2022-03-09 VITALS — BP 121/80 | HR 56 | Temp 98.0°F | Ht 74.0 in | Wt 224.2 lb

## 2022-03-09 DIAGNOSIS — M1A9XX0 Chronic gout, unspecified, without tophus (tophi): Secondary | ICD-10-CM | POA: Diagnosis not present

## 2022-03-09 DIAGNOSIS — Z0000 Encounter for general adult medical examination without abnormal findings: Secondary | ICD-10-CM

## 2022-03-09 DIAGNOSIS — I25118 Atherosclerotic heart disease of native coronary artery with other forms of angina pectoris: Secondary | ICD-10-CM

## 2022-03-09 DIAGNOSIS — I10 Essential (primary) hypertension: Secondary | ICD-10-CM

## 2022-03-09 LAB — CBC
HCT: 46.4 % (ref 39.0–52.0)
Hemoglobin: 16 g/dL (ref 13.0–17.0)
MCHC: 34.5 g/dL (ref 30.0–36.0)
MCV: 96.3 fl (ref 78.0–100.0)
Platelets: 186 10*3/uL (ref 150.0–400.0)
RBC: 4.82 Mil/uL (ref 4.22–5.81)
RDW: 12.6 % (ref 11.5–15.5)
WBC: 5.6 10*3/uL (ref 4.0–10.5)

## 2022-03-09 LAB — LIPID PANEL
Cholesterol: 156 mg/dL (ref 0–200)
HDL: 48 mg/dL (ref >39.00)
LDL Cholesterol: 88 mg/dL (ref 0–99)
NonHDL: 107.71
Total CHOL/HDL Ratio: 3
Triglycerides: 101 mg/dL (ref 0.0–149.0)
VLDL: 20.2 mg/dL (ref 0.0–40.0)

## 2022-03-09 LAB — COMPREHENSIVE METABOLIC PANEL
ALT: 22 U/L (ref 0–53)
AST: 18 U/L (ref 0–37)
Albumin: 4.3 g/dL (ref 3.5–5.2)
Alkaline Phosphatase: 54 U/L (ref 39–117)
BUN: 17 mg/dL (ref 6–23)
CO2: 25 mEq/L (ref 19–32)
Calcium: 9.4 mg/dL (ref 8.4–10.5)
Chloride: 104 mEq/L (ref 96–112)
Creatinine, Ser: 1.03 mg/dL (ref 0.40–1.50)
GFR: 75.59 mL/min (ref >60.00)
Glucose, Bld: 98 mg/dL (ref 70–99)
Potassium: 4.3 mEq/L (ref 3.5–5.1)
Sodium: 138 mEq/L (ref 135–145)
Total Bilirubin: 1 mg/dL (ref 0.2–1.2)
Total Protein: 6.8 g/dL (ref 6.0–8.3)

## 2022-03-09 LAB — URIC ACID: Uric Acid, Serum: 7.1 mg/dL (ref 4.0–7.8)

## 2022-03-09 NOTE — Progress Notes (Signed)
Chief Complaint  Patient presents with   Annual Exam    [name] is here for a complete physical.   His last physical was >1 year ago.  Current diet: in general, a "healthy" diet.   Current exercise: some cardio, lifting weights,  Weight trend: stable Fatigue out of ordinary? No. Seat belt? Yes.   Advanced directive? Yes  Health maintenance Shingrix- Yes Colonoscopy- Yes Tetanus- Yes Hep C- Yes Pneumonia vaccine- Yes AAA screening- Yes  Past Medical History:  Diagnosis Date   Heart disease    Hypertension      Past Surgical History:  Procedure Laterality Date   arthoscopic knee surgery     COLONOSCOPY     > 10 yrs.  It was normal    Medications  Current Outpatient Medications on File Prior to Visit  Medication Sig Dispense Refill   aspirin EC 81 MG tablet Take 81 mg by mouth daily.     carvedilol (COREG) 3.125 MG tablet Take 1 tablet (3.125 mg total) by mouth 2 (two) times daily with a meal. 180 tablet 2   Colchicine (MITIGARE) 0.6 MG CAPS 2 caps now, 1 cap 1 hour later.  If symptoms persist, you may repeat the following day. 30 capsule 1   lisinopril (ZESTRIL) 20 MG tablet Take 1 tablet (20 mg total) by mouth daily. 90 tablet 2   meloxicam (MOBIC) 7.5 MG tablet Take 1 tablet by mouth once daily 30 tablet 0   rosuvastatin (CRESTOR) 5 MG tablet Take 1 tablet (5 mg total) by mouth daily. 90 tablet 2   tiZANidine (ZANAFLEX) 4 MG tablet Take 1 tablet (4 mg total) by mouth every 6 (six) hours as needed for muscle spasms. 30 tablet 0   Allergies No Known Allergies  Family History Family History  Problem Relation Age of Onset   Arthritis Mother    Cancer Mother    Diabetes Mother    Heart disease Mother    Hypertension Mother    Colon cancer Mother 88   Alcohol abuse Father    Arthritis Father    Diabetes Father    Heart disease Father    Hypertension Father    Cancer Sister    Diabetes Sister    Hypertension Sister    Kidney disease Sister     Diabetes Brother    Hyperlipidemia Brother    Hypertension Brother    Colon polyps Neg Hx    Rectal cancer Neg Hx    Stomach cancer Neg Hx    Esophageal cancer Neg Hx     Review of Systems: Constitutional:  no fevers Eye:  no recent significant change in vision Ears:  No changes in hearing Nose/Mouth/Throat:  no complaints of nasal congestion, no sore throat Cardiovascular: no chest pain Respiratory:  No shortness of breath Gastrointestinal:  No change in bowel habits GU:  No frequency Integumentary:  no abnormal skin lesions reported Neurologic:  no headaches Endocrine:  denies unexplained weight changes  Exam BP 121/80   Pulse (!) 56   Temp 98 F (36.7 C) (Oral)   Ht '6\' 2"'$  (1.88 m)   Wt 224 lb 4 oz (101.7 kg)   SpO2 95%   BMI 28.79 kg/m  General:  well developed, well nourished, in no apparent distress Skin:  no significant moles, warts, or growths Head:  no masses, lesions, or tenderness Eyes:  pupils equal and round, sclera anicteric without injection Ears:  canals without lesions, TMs shiny without retraction,  no obvious effusion, no erythema Nose:  nares patent, septum midline, mucosa normal Throat/Pharynx:  lips and gingiva without lesion; tongue and uvula midline; non-inflamed pharynx; no exudates or postnasal drainage Lungs:  clear to auscultation, breath sounds equal bilaterally, no respiratory distress Cardio:  regular rhythm, bradycardic, no LE edema or bruits Rectal: Deferred GI: BS+, S, NT, ND, no masses or organomegaly Musculoskeletal:  symmetrical muscle groups noted without atrophy or deformity Neuro:  gait normal; deep tendon reflexes normal and symmetric Psych: well oriented with normal range of affect and appropriate judgment/insight  Assessment and Plan  Well adult exam  Coronary artery disease of native heart with stable angina pectoris, unspecified vessel or lesion type ([place]) - Plan: Comprehensive metabolic panel, Lipid panel  Essential  hypertension - Plan: CBC  Chronic gout without tophus, unspecified cause, unspecified site - Plan: Uric acid   Well 66 y.o. male. Counseled on diet and exercise. Discussed risks/benefits of prostate cancer screening w PSA. Agreed to forego screening. Advanced directive form requested today.  Other orders as above. Follow up in 6 mo or pr.  The patient voiced understanding and agreement to the plan.  [place], DO 03/09/22 7:43 AM

## 2022-03-09 NOTE — Patient Instructions (Addendum)
Give [place] 2-3 business days to get the results of your labs back.   Keep the diet clean and stay active.  Please get me a copy of your advanced directive form at your convenience. Just the medical power of attorney and living will portion.   I recommend getting the flu shot in mid October. This suggestion would change if the CDC comes out with a different recommendation.   Send me a message in the next few weeks if we take a step back with the back pain and we will proceed with the MRI.   Let us know if you need anything.

## 2022-03-22 ENCOUNTER — Encounter: Payer: Medicare HMO | Admitting: Physical Therapy

## 2022-05-11 DIAGNOSIS — H524 Presbyopia: Secondary | ICD-10-CM | POA: Diagnosis not present

## 2022-05-11 DIAGNOSIS — H52223 Regular astigmatism, bilateral: Secondary | ICD-10-CM | POA: Diagnosis not present

## 2022-05-11 DIAGNOSIS — H5203 Hypermetropia, bilateral: Secondary | ICD-10-CM | POA: Diagnosis not present

## 2022-05-25 ENCOUNTER — Other Ambulatory Visit: Payer: Self-pay | Admitting: Family Medicine

## 2022-05-25 DIAGNOSIS — I25118 Atherosclerotic heart disease of native coronary artery with other forms of angina pectoris: Secondary | ICD-10-CM

## 2022-05-25 DIAGNOSIS — I10 Essential (primary) hypertension: Secondary | ICD-10-CM

## 2022-09-01 ENCOUNTER — Other Ambulatory Visit: Payer: Self-pay | Admitting: Family Medicine

## 2022-09-01 DIAGNOSIS — I25118 Atherosclerotic heart disease of native coronary artery with other forms of angina pectoris: Secondary | ICD-10-CM

## 2022-09-01 DIAGNOSIS — I10 Essential (primary) hypertension: Secondary | ICD-10-CM

## 2022-09-07 ENCOUNTER — Ambulatory Visit (INDEPENDENT_AMBULATORY_CARE_PROVIDER_SITE_OTHER): Payer: Medicare HMO | Admitting: Family Medicine

## 2022-09-07 ENCOUNTER — Encounter: Payer: Self-pay | Admitting: Family Medicine

## 2022-09-07 VITALS — BP 132/80 | HR 53 | Temp 97.7°F | Ht 74.0 in | Wt 223.2 lb

## 2022-09-07 DIAGNOSIS — M1A9XX0 Chronic gout, unspecified, without tophus (tophi): Secondary | ICD-10-CM | POA: Diagnosis not present

## 2022-09-07 DIAGNOSIS — I10 Essential (primary) hypertension: Secondary | ICD-10-CM

## 2022-09-07 DIAGNOSIS — I25118 Atherosclerotic heart disease of native coronary artery with other forms of angina pectoris: Secondary | ICD-10-CM

## 2022-09-07 LAB — COMPREHENSIVE METABOLIC PANEL
ALT: 24 U/L (ref 0–53)
AST: 20 U/L (ref 0–37)
Albumin: 4.2 g/dL (ref 3.5–5.2)
Alkaline Phosphatase: 59 U/L (ref 39–117)
BUN: 22 mg/dL (ref 6–23)
CO2: 26 mEq/L (ref 19–32)
Calcium: 9.8 mg/dL (ref 8.4–10.5)
Chloride: 102 mEq/L (ref 96–112)
Creatinine, Ser: 0.98 mg/dL (ref 0.40–1.50)
GFR: 79.97 mL/min (ref >60.00)
Glucose, Bld: 101 mg/dL — ABNORMAL HIGH (ref 70–99)
Potassium: 4.4 mEq/L (ref 3.5–5.1)
Sodium: 137 mEq/L (ref 135–145)
Total Bilirubin: 0.8 mg/dL (ref 0.2–1.2)
Total Protein: 6.7 g/dL (ref 6.0–8.3)

## 2022-09-07 LAB — LIPID PANEL
Cholesterol: 149 mg/dL (ref 0–200)
HDL: 46 mg/dL (ref >39.00)
LDL Cholesterol: 86 mg/dL (ref 0–99)
NonHDL: 103.22
Total CHOL/HDL Ratio: 3
Triglycerides: 84 mg/dL (ref 0.0–149.0)
VLDL: 16.8 mg/dL (ref 0.0–40.0)

## 2022-09-07 LAB — URIC ACID: Uric Acid, Serum: 7.8 mg/dL (ref 4.0–7.8)

## 2022-09-07 NOTE — Progress Notes (Signed)
Chief Complaint  Patient presents with   Follow-up    Subjective Logan Fox is a 67 y.o. male who presents for hypertension follow up. He does not routinely monitor home blood pressures. He is compliant with medications- lisinopril 20 mg/d, Coreg 3.125 mg bid. Patient has these side effects of medication: none He is usually adhering to a healthy diet overall. 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  CAD Patient presents for CAD follow up. Currently being treated with Crestor 5 mg 3 times weekly, aspirin daily and compliance with treatment thus far has been good. He denies myalgias. Diet/exercise as above.   Gout History of gout, diet controlled.  Takes colchicine as needed.  Has not had a flare in the last year.   Past Medical History:  Diagnosis Date   Heart disease    Hypertension     Exam BP 132/80 (BP Location: Left Arm, Patient Position: Sitting, Cuff Size: Normal)   Pulse (!) 53   Temp 97.7 F (36.5 C) (Oral)   Ht '6\' 2"'$  (1.88 m)   Wt 223 lb 4 oz (101.3 kg)   SpO2 96%   BMI 28.66 kg/m  General:  well developed, well nourished, in no apparent distress Heart: Reg rhythm, bradycardic, no bruits, no LE edema Lungs: clear to auscultation, no accessory muscle use Psych: well oriented with normal range of affect and appropriate judgment/insight  Essential hypertension  Coronary artery disease of native heart with stable angina pectoris, unspecified vessel or lesion type ([facility]) - Plan: Comprehensive metabolic panel, Lipid panel  Chronic gout without tophus, unspecified cause, unspecified site - Plan: Uric acid  Chronic, stable.  Continue lisinopril 20 mg daily, Coreg 3.125 mg twice daily counseled on diet and exercise. Chronic, stable.  Continue Crestor 5 mg 3 times weekly.  Continue aspirin daily. Chronic, stable.  Continue diet control.  Colchicine as needed. F/u in 6 mo. The patient voiced understanding and agreement to the  plan.  [place], DO 09/07/22  8:05 AM

## 2022-09-07 NOTE — Patient Instructions (Signed)
Give us 2-3 business days to get the results of your labs back.   Keep the diet clean and stay active.  Let us know if you need anything. 

## 2022-10-09 DIAGNOSIS — R69 Illness, unspecified: Secondary | ICD-10-CM | POA: Diagnosis not present

## 2022-11-24 ENCOUNTER — Other Ambulatory Visit: Payer: Self-pay | Admitting: Family Medicine

## 2022-11-24 DIAGNOSIS — I25118 Atherosclerotic heart disease of native coronary artery with other forms of angina pectoris: Secondary | ICD-10-CM

## 2022-11-24 DIAGNOSIS — I10 Essential (primary) hypertension: Secondary | ICD-10-CM

## 2022-11-29 ENCOUNTER — Other Ambulatory Visit: Payer: Self-pay | Admitting: Family Medicine

## 2022-11-29 DIAGNOSIS — I10 Essential (primary) hypertension: Secondary | ICD-10-CM

## 2022-11-29 DIAGNOSIS — I25118 Atherosclerotic heart disease of native coronary artery with other forms of angina pectoris: Secondary | ICD-10-CM

## 2022-12-03 ENCOUNTER — Encounter: Payer: Self-pay | Admitting: Family Medicine

## 2023-02-10 ENCOUNTER — Encounter (INDEPENDENT_AMBULATORY_CARE_PROVIDER_SITE_OTHER): Payer: Self-pay

## 2023-02-26 ENCOUNTER — Other Ambulatory Visit: Payer: Self-pay | Admitting: Family Medicine

## 2023-02-26 DIAGNOSIS — I25118 Atherosclerotic heart disease of native coronary artery with other forms of angina pectoris: Secondary | ICD-10-CM

## 2023-02-26 DIAGNOSIS — I10 Essential (primary) hypertension: Secondary | ICD-10-CM

## 2023-02-28 ENCOUNTER — Other Ambulatory Visit: Payer: Self-pay | Admitting: Family Medicine

## 2023-02-28 DIAGNOSIS — I10 Essential (primary) hypertension: Secondary | ICD-10-CM

## 2023-02-28 DIAGNOSIS — I25118 Atherosclerotic heart disease of native coronary artery with other forms of angina pectoris: Secondary | ICD-10-CM

## 2023-03-18 ENCOUNTER — Encounter: Payer: Self-pay | Admitting: Family Medicine

## 2023-03-18 ENCOUNTER — Ambulatory Visit (INDEPENDENT_AMBULATORY_CARE_PROVIDER_SITE_OTHER): Payer: Medicare HMO | Admitting: Family Medicine

## 2023-03-18 VITALS — BP 136/80 | HR 89 | Temp 98.0°F | Ht 74.0 in | Wt 227.0 lb

## 2023-03-18 DIAGNOSIS — Z0000 Encounter for general adult medical examination without abnormal findings: Secondary | ICD-10-CM

## 2023-03-18 DIAGNOSIS — I10 Essential (primary) hypertension: Secondary | ICD-10-CM

## 2023-03-18 DIAGNOSIS — M1A9XX0 Chronic gout, unspecified, without tophus (tophi): Secondary | ICD-10-CM | POA: Diagnosis not present

## 2023-03-18 LAB — CBC
HCT: 46.8 % (ref 39.0–52.0)
Hemoglobin: 15.7 g/dL (ref 13.0–17.0)
MCHC: 33.5 g/dL (ref 30.0–36.0)
MCV: 97.1 fl (ref 78.0–100.0)
Platelets: 203 10*3/uL (ref 150.0–400.0)
RBC: 4.82 Mil/uL (ref 4.22–5.81)
RDW: 12.7 % (ref 11.5–15.5)
WBC: 6.3 10*3/uL (ref 4.0–10.5)

## 2023-03-18 LAB — COMPREHENSIVE METABOLIC PANEL
ALT: 22 U/L (ref 0–53)
AST: 20 U/L (ref 0–37)
Albumin: 4.4 g/dL (ref 3.5–5.2)
Alkaline Phosphatase: 56 U/L (ref 39–117)
BUN: 23 mg/dL (ref 6–23)
CO2: 28 mEq/L (ref 19–32)
Calcium: 9.4 mg/dL (ref 8.4–10.5)
Chloride: 103 mEq/L (ref 96–112)
Creatinine, Ser: 1.1 mg/dL (ref 0.40–1.50)
GFR: 69.36 mL/min (ref >60.00)
Glucose, Bld: 106 mg/dL — ABNORMAL HIGH (ref 70–99)
Potassium: 4.3 mEq/L (ref 3.5–5.1)
Sodium: 139 mEq/L (ref 135–145)
Total Bilirubin: 1 mg/dL (ref 0.2–1.2)
Total Protein: 6.7 g/dL (ref 6.0–8.3)

## 2023-03-18 LAB — LIPID PANEL
Cholesterol: 124 mg/dL (ref 0–200)
HDL: 48.1 mg/dL (ref >39.00)
LDL Cholesterol: 63 mg/dL (ref 0–99)
NonHDL: 75.8
Total CHOL/HDL Ratio: 3
Triglycerides: 62 mg/dL (ref 0.0–149.0)
VLDL: 12.4 mg/dL (ref 0.0–40.0)

## 2023-03-18 LAB — URIC ACID: Uric Acid, Serum: 7.1 mg/dL (ref 4.0–7.8)

## 2023-03-18 MED ORDER — SILDENAFIL CITRATE 100 MG PO TABS: 50.0000 mg | ORAL_TABLET | Freq: Every day | ORAL | 1 refills | Status: DC | PRN

## 2023-03-18 NOTE — Progress Notes (Signed)
Chief Complaint  Patient presents with   Annual Exam    Well Male [name] is here for a complete physical.   His last physical was >1 year ago.  Current diet: in general, a "healthy" diet.   Current exercise: some wt training Weight trend: up a few lbs Fatigue out of ordinary? No. Seat belt? Yes.   Advanced directive? Yes  Health maintenance Shingrix- Yes Colonoscopy- Yes Tetanus- Yes Hep C- Yes Pneumonia vaccine- Yes  Past Medical History:  Diagnosis Date   Heart disease    Hypertension      Past Surgical History:  Procedure Laterality Date   arthoscopic knee surgery     COLONOSCOPY     > 10 yrs.  It was normal    Medications  Current Outpatient Medications on File Prior to Visit  Medication Sig Dispense Refill   aspirin EC 81 MG tablet Take 81 mg by mouth daily.     carvedilol (COREG) 3.125 MG tablet TAKE 1 TABLET BY MOUTH TWICE DAILY WITH A MEAL 180 tablet 0   Colchicine (MITIGARE) 0.6 MG CAPS 2 caps now, 1 cap 1 hour later.  If symptoms persist, you may repeat the following day. 30 capsule 1   lisinopril (ZESTRIL) 20 MG tablet Take 1 tablet by mouth once daily 90 tablet 0   rosuvastatin (CRESTOR) 5 MG tablet Take 1 tablet by mouth once daily 90 tablet 0   Allergies No Known Allergies  Family History Family History  Problem Relation Age of Onset   Arthritis Mother    Cancer Mother    Diabetes Mother    Heart disease Mother    Hypertension Mother    Colon cancer Mother 65   Alcohol abuse Father    Arthritis Father    Diabetes Father    Heart disease Father    Hypertension Father    Cancer Sister    Diabetes Sister    Hypertension Sister    Kidney disease Sister    Diabetes Brother    Hyperlipidemia Brother    Hypertension Brother    Colon polyps Neg Hx    Rectal cancer Neg Hx    Stomach cancer Neg Hx    Esophageal cancer Neg Hx     Review of Systems: Constitutional:  no fevers Eye:  no recent significant change in vision Ears:  No  changes in hearing Nose/Mouth/Throat:  no complaints of nasal congestion, no sore throat Cardiovascular: no chest pain Respiratory:  No shortness of breath Gastrointestinal:  No change in bowel habits GU:  No frequency Integumentary:  no abnormal skin lesions reported Neurologic:  no headaches Endocrine:  denies unexplained weight changes  Exam BP 136/80 (BP Location: Left Arm, Patient Position: Sitting, Cuff Size: Large)   Pulse 89   Temp 98 F (36.7 C) (Oral)   Ht 6\' 2"  (1.88 m)   Wt 227 lb (103 kg)   SpO2 99%   BMI 29.15 kg/m  General:  well developed, well nourished, in no apparent distress Skin:  no significant moles, warts, or growths Head:  no masses, lesions, or tenderness Eyes:  pupils equal and round, sclera anicteric without injection Ears:  canals without lesions, TMs shiny without retraction, no obvious effusion, no erythema Nose:  nares patent, mucosa normal Throat/Pharynx:  lips and gingiva without lesion; tongue and uvula midline; non-inflamed pharynx; no exudates or postnasal drainage Lungs:  clear to auscultation, breath sounds equal bilaterally, no respiratory distress Cardio:  regular rate and rhythm, no  LE edema or bruits Rectal: Deferred GI: BS+, S, NT, ND, no masses or organomegaly Musculoskeletal:  symmetrical muscle groups noted without atrophy or deformity Neuro:  gait normal; deep tendon reflexes normal and symmetric Psych: well oriented with normal range of affect and appropriate judgment/insight  Assessment and Plan  Well adult exam  Essential hypertension - Plan: CBC, Comprehensive metabolic panel, Lipid panel  Chronic gout without tophus, unspecified cause, unspecified site - Plan: Uric acid   Well 67 y.o. male. Counseled on diet and exercise. Other orders as above. Follow up in 6 mo.  The patient voiced understanding and agreement to the plan.  Logan Roche [place], DO 03/18/23 8:02 AM

## 2023-03-18 NOTE — Patient Instructions (Addendum)
Give [place] 2-3 business days to get the results of your labs back.   Keep the diet clean and stay active.  I recommend getting the flu shot in mid October. This suggestion would change if the CDC comes out with a different recommendation.   Use GoodRx for the sildenafil.   Let us know if you need anything.

## 2023-04-15 DIAGNOSIS — H52223 Regular astigmatism, bilateral: Secondary | ICD-10-CM | POA: Diagnosis not present

## 2023-05-17 DIAGNOSIS — H524 Presbyopia: Secondary | ICD-10-CM | POA: Diagnosis not present

## 2023-05-17 DIAGNOSIS — H5203 Hypermetropia, bilateral: Secondary | ICD-10-CM | POA: Diagnosis not present

## 2023-05-17 DIAGNOSIS — H52223 Regular astigmatism, bilateral: Secondary | ICD-10-CM | POA: Diagnosis not present

## 2023-05-28 ENCOUNTER — Other Ambulatory Visit: Payer: Self-pay | Admitting: Family Medicine

## 2023-05-28 DIAGNOSIS — I25118 Atherosclerotic heart disease of native coronary artery with other forms of angina pectoris: Secondary | ICD-10-CM

## 2023-05-28 DIAGNOSIS — I10 Essential (primary) hypertension: Secondary | ICD-10-CM

## 2023-06-22 ENCOUNTER — Other Ambulatory Visit: Payer: Self-pay | Admitting: Medical Genetics

## 2023-07-22 ENCOUNTER — Other Ambulatory Visit (HOSPITAL_COMMUNITY): Payer: Self-pay | Attending: Medical Genetics

## 2023-08-01 DIAGNOSIS — H52223 Regular astigmatism, bilateral: Secondary | ICD-10-CM | POA: Diagnosis not present

## 2023-08-12 NOTE — Progress Notes (Signed)
Pt did not answer phone for AWVI.This encounter was created in error - please disregard.

## 2023-08-15 DIAGNOSIS — L570 Actinic keratosis: Secondary | ICD-10-CM | POA: Diagnosis not present

## 2023-08-15 DIAGNOSIS — D225 Melanocytic nevi of trunk: Secondary | ICD-10-CM | POA: Diagnosis not present

## 2023-08-15 DIAGNOSIS — L918 Other hypertrophic disorders of the skin: Secondary | ICD-10-CM | POA: Diagnosis not present

## 2023-08-15 DIAGNOSIS — Z129 Encounter for screening for malignant neoplasm, site unspecified: Secondary | ICD-10-CM | POA: Diagnosis not present

## 2023-08-15 DIAGNOSIS — L821 Other seborrheic keratosis: Secondary | ICD-10-CM | POA: Diagnosis not present

## 2023-09-16 ENCOUNTER — Ambulatory Visit: Payer: Medicare HMO | Admitting: Family Medicine

## 2023-09-16 ENCOUNTER — Encounter: Payer: Self-pay | Admitting: Family Medicine

## 2023-09-16 VITALS — BP 132/78 | HR 54 | Temp 98.2°F | Resp 20 | Ht 74.0 in | Wt 225.0 lb

## 2023-09-16 DIAGNOSIS — M1A9XX0 Chronic gout, unspecified, without tophus (tophi): Secondary | ICD-10-CM

## 2023-09-16 DIAGNOSIS — I10 Essential (primary) hypertension: Secondary | ICD-10-CM | POA: Diagnosis not present

## 2023-09-16 DIAGNOSIS — I25118 Atherosclerotic heart disease of native coronary artery with other forms of angina pectoris: Secondary | ICD-10-CM | POA: Diagnosis not present

## 2023-09-16 LAB — COMPREHENSIVE METABOLIC PANEL
ALT: 30 U/L (ref 0–53)
AST: 23 U/L (ref 0–37)
Albumin: 4.7 g/dL (ref 3.5–5.2)
Alkaline Phosphatase: 61 U/L (ref 39–117)
BUN: 19 mg/dL (ref 6–23)
CO2: 32 meq/L (ref 19–32)
Calcium: 10.2 mg/dL (ref 8.4–10.5)
Chloride: 102 meq/L (ref 96–112)
Creatinine, Ser: 1.05 mg/dL (ref 0.40–1.50)
GFR: 73.09 mL/min (ref >60.00)
Glucose, Bld: 102 mg/dL — ABNORMAL HIGH (ref 70–99)
Potassium: 4.5 meq/L (ref 3.5–5.1)
Sodium: 140 meq/L (ref 135–145)
Total Bilirubin: 1.1 mg/dL (ref 0.2–1.2)
Total Protein: 7.2 g/dL (ref 6.0–8.3)

## 2023-09-16 LAB — LIPID PANEL
Cholesterol: 147 mg/dL (ref 0–200)
HDL: 45.9 mg/dL (ref >39.00)
LDL Cholesterol: 77 mg/dL (ref 0–99)
NonHDL: 101.53
Total CHOL/HDL Ratio: 3
Triglycerides: 125 mg/dL (ref 0.0–149.0)
VLDL: 25 mg/dL (ref 0.0–40.0)

## 2023-09-16 LAB — URIC ACID: Uric Acid, Serum: 6.9 mg/dL (ref 4.0–7.8)

## 2023-09-16 NOTE — Progress Notes (Signed)
 Chief Complaint  Patient presents with   Follow-up    6 month    Subjective Logan Fox is a 68 y.o. male who presents for hypertension follow up. He does monitor home blood pressures. Blood pressures ranging from 130's/80's on average. He is compliant with medications- Coreg 3.125 mg bid, lisinopril 20 mg/d. Patient has these side effects of medication: none He is adhering to a healthy diet overall. Current exercise: some strength training, cardio No CP or SOB.   Hyperlipidemia Patient presents for hyperlipidemia follow up. Currently being treated with Crestor 5 mg/d and compliance with treatment thus far has been good. He denies myalgias. Diet/exercise as above.  The patient is known to have coexisting coronary artery disease.  Gout No recent flare in several years. Takes no meds routinely. Colchicine.    Past Medical History:  Diagnosis Date   Heart disease    Hypertension     Exam BP 132/78 (BP Location: Left Arm, Cuff Size: Normal)   Pulse (!) 54   Temp 98.2 F (36.8 C)   Resp 20   Ht 6\' 2"  (1.88 m)   Wt 225 lb (102.1 kg)   SpO2 100%   BMI 28.89 kg/m  General:  well developed, well nourished, in no apparent distress Heart: RRR, no bruits, no LE edema Lungs: clear to auscultation, no accessory muscle use Psych: well oriented with normal range of affect and appropriate judgment/insight  Essential hypertension  Coronary artery disease of native heart with stable angina pectoris, unspecified vessel or lesion type ([facility]) - Plan: Comprehensive metabolic panel, Lipid panel  Chronic gout without tophus, unspecified cause, unspecified site - Plan: Uric acid  Chronic, stable. Cont lisinopril 20 mg/d, Coreg 3.125 mg bid. Counseled on diet and exercise. Chronic, stable. Cont Crestor 5 mg/d, Coreg.  Chronic, stable. Cont diet control.  F/u in 6 mo. The patient voiced understanding and agreement to the plan.  Jilda Roche [place], DO 09/16/23  8:02 AM

## 2023-09-16 NOTE — Patient Instructions (Signed)
 Give [place] 2-3 business days to get the results of your labs back.   Keep the diet clean and stay active.  Let us know if you need anything.

## 2023-11-20 ENCOUNTER — Other Ambulatory Visit: Payer: Self-pay | Admitting: Family Medicine

## 2023-11-20 DIAGNOSIS — I25118 Atherosclerotic heart disease of native coronary artery with other forms of angina pectoris: Secondary | ICD-10-CM

## 2023-11-20 DIAGNOSIS — I10 Essential (primary) hypertension: Secondary | ICD-10-CM

## 2024-02-19 ENCOUNTER — Other Ambulatory Visit: Payer: Self-pay | Admitting: Family Medicine

## 2024-02-19 DIAGNOSIS — I10 Essential (primary) hypertension: Secondary | ICD-10-CM

## 2024-02-19 DIAGNOSIS — I25118 Atherosclerotic heart disease of native coronary artery with other forms of angina pectoris: Secondary | ICD-10-CM

## 2024-03-09 ENCOUNTER — Ambulatory Visit (INDEPENDENT_AMBULATORY_CARE_PROVIDER_SITE_OTHER): Admitting: *Deleted

## 2024-03-09 VITALS — Ht 74.0 in | Wt 223.0 lb

## 2024-03-09 DIAGNOSIS — Z0000 Encounter for general adult medical examination without abnormal findings: Secondary | ICD-10-CM | POA: Diagnosis not present

## 2024-03-09 NOTE — Progress Notes (Signed)
 Subjective:   Logan Fox is a 68 y.o. who presents for a Medicare Wellness preventive visit.  As a reminder, Annual Wellness Visits don't include a physical exam, and some assessments may be limited, especially if this visit is performed virtually. We may recommend an in-person follow-up visit with your provider if needed.  Visit Complete: Virtual I connected with  Logan Fox on 03/09/24 by a audio enabled telemedicine application and verified that I am speaking with the correct person using two identifiers.  Patient Location: Home  Provider Location: Office/Clinic  I discussed the limitations of evaluation and management by telemedicine. The patient expressed understanding and agreed to proceed.  Vital Signs: Because this visit was a virtual/telehealth visit, some criteria may be missing or patient reported. Any vitals not documented were not able to be obtained and vitals that have been documented are patient reported.  VideoDeclined- This patient declined Librarian, academic. Therefore the visit was completed with audio only.  Persons Participating in Visit: Patient.  AWV Questionnaire: Yes: Patient Medicare AWV questionnaire was completed by the patient on 03/08/24; I have confirmed that all information answered by patient is correct and no changes since this date.  Cardiac Risk Factors include: advanced age (>95men, >60 women);male gender;hypertension;dyslipidemia;Other (see comment), Risk factor comments: CAD     Objective:    Today's Vitals   03/09/24 0825  Weight: 223 lb (101.2 kg)  Height: 6' 2 (1.88 m)   Body mass index is 28.63 kg/m.     03/09/2024    8:31 AM 02/15/2022    8:48 AM  Advanced Directives  Does Patient Have a Medical Advance Directive? Yes Yes  Type of Estate agent of [place];Living will Healthcare Power of [place];Living will  Does patient want to make changes to medical advance directive? No  - Patient declined No - Patient declined  Copy of Healthcare Power of Attorney in Chart? Yes - validated most recent copy scanned in chart (See row information)     Current Medications (verified) Outpatient Encounter Medications as of 03/09/2024  Medication Sig   aspirin EC 81 MG tablet Take 81 mg by mouth daily.   carvedilol  (COREG ) 3.125 MG tablet TAKE 1 TABLET BY MOUTH TWICE DAILY WITH A MEAL   Colchicine  (MITIGARE ) 0.6 MG CAPS 2 caps now, 1 cap 1 hour later.  If symptoms persist, you may repeat the following day.   lisinopril  (ZESTRIL ) 20 MG tablet Take 1 tablet by mouth once daily   rosuvastatin  (CRESTOR ) 5 MG tablet Take 1 tablet by mouth once daily   No facility-administered encounter medications on file as of 03/09/2024.    Allergies (verified) Patient has no known allergies.   History: Past Medical History:  Diagnosis Date   Heart disease    Hypertension    Past Surgical History:  Procedure Laterality Date   arthoscopic knee surgery     COLONOSCOPY     > 10 yrs.  It was normal   Family History  Problem Relation Age of Onset   Arthritis Mother    Cancer Mother    Diabetes Mother    Heart disease Mother    Hypertension Mother    Colon cancer Mother 13   Alcohol abuse Father    Arthritis Father    Diabetes Father    Heart disease Father    Hypertension Father    Cancer Sister    Diabetes Sister    Hypertension Sister    Kidney disease  Sister    Diabetes Brother    Hyperlipidemia Brother    Hypertension Brother    Colon polyps Neg Hx    Rectal cancer Neg Hx    Stomach cancer Neg Hx    Esophageal cancer Neg Hx    Social History   Socioeconomic History   Marital status: Married    Spouse name: Not on file   Number of children: Not on file   Years of education: Not on file   Highest education level: Associate degree: occupational, Scientist, product/process development, or vocational program  Occupational History   Not on file  Tobacco Use   Smoking status: Former    Current  packs/day: 0.00    Types: Cigarettes    Start date: 1973    Quit date: 1989    Years since quitting: 36.7   Smokeless tobacco: Never  Vaping Use   Vaping status: Never Used  Substance and Sexual Activity   Alcohol use: Yes    Alcohol/week: 2.0 standard drinks of alcohol    Types: 2 Standard drinks or equivalent per week    Comment: 2 drinks at night   Drug use: No   Sexual activity: Yes    Partners: Female  Other Topics Concern   Not on file  Social History Narrative   Not on file   Social Drivers of Health   Financial Resource Strain: Low Risk  (03/08/2024)   Overall Financial Resource Strain (CARDIA)    Difficulty of Paying Living Expenses: Not hard at all  Food Insecurity: No Food Insecurity (03/09/2024)   Hunger Vital Sign    Worried About Running Out of Food in the Last Year: Never true    Ran Out of Food in the Last Year: Never true  Transportation Needs: No Transportation Needs (03/08/2024)   PRAPARE - Administrator, Civil Service (Medical): No    Lack of Transportation (Non-Medical): No  Physical Activity: Sufficiently Active (03/08/2024)   Exercise Vital Sign    Days of Exercise per Week: 3 days    Minutes of Exercise per Session: 60 min  Stress: No Stress Concern Present (03/08/2024)   [employer] of Occupational Health - Occupational Stress Questionnaire    Feeling of Stress: Only a little  Social Connections: Moderately Integrated (03/08/2024)   Social Connection and Isolation Panel    Frequency of Communication with Friends and Family: Once a week    Frequency of Social Gatherings with Friends and Family: Once a week    Attends Religious Services: More than 4 times per year    Active Member of [employer] or Organizations: Yes    Attends Engineer, structural: More than 4 times per year    Marital Status: Married    Tobacco Counseling Counseling given: Not Answered    Clinical Intake:  Pre-visit preparation completed: Yes  Pain :  No/denies pain     BMI - recorded: 28.63 Nutritional Status: BMI 25 -29 Overweight Nutritional Risks: None Diabetes: No  No results found for: HGBA1C   How often do you need to have someone help you when you read instructions, pamphlets, or other written materials from your doctor or pharmacy?: 1 - Never What is the last grade level you completed in school?: asociate's degree  Interpreter Needed?: No  Information entered by :: Lolita Libra, CMA(AAMA)   Activities of Daily Living     03/08/2024    8:00 PM  In your present state of health, do you have any difficulty performing  the following activities:  Hearing? 0  Vision? 0  Difficulty concentrating or making decisions? 0  Walking or climbing stairs? 0  Dressing or bathing? 0  Doing errands, shopping? 0  Preparing Food and eating ? N  Using the Toilet? N  In the past six months, have you accidently leaked urine? N  Do you have problems with loss of bowel control? N  Managing your Medications? N  Managing your Finances? N  Housekeeping or managing your Housekeeping? N    Patient Care Team: Frann Mabel Mt, DO as PCP - General (Family Medicine) [facility]  I have updated your Care Teams any recent Medical Services you may have received from other providers in the past year.     Assessment:   This is a routine wellness examination for [employer] [facility].  Hearing/Vision screen Hearing Screening - Comments:: Has moderate hearing loss and wears aids if out in crowds. Vision Screening - Comments:: Up to date with routine eye exams with [facility] in [facility].   Goals Addressed               This Visit's Progress     Patient Stated (pt-stated)        To work way into retirement at the end of next year and do volunteer work       Depression Screen     03/09/2024    8:30 AM 03/18/2023    7:33 AM 03/09/2022    7:32 AM 07/31/2021   11:14 AM 03/04/2021    7:23 AM 02/29/2020    7:23 AM  PHQ 2/9  Scores  PHQ - 2 Score 0 0 0 0 0 0  PHQ- 9 Score 0 0 1       Fall Risk     03/08/2024    8:00 PM 03/18/2023    7:33 AM 09/07/2022    7:43 AM 03/09/2022    7:31 AM 07/31/2021   11:14 AM  Fall Risk   Falls in the past year? 0 0 0 0 0  Number falls in past yr: 0 0 0 0 0  Injury with Fall? 0 0 0 0 0  Risk for fall due to :  No Fall Risks  No Fall Risks No Fall Risks  Follow up  Falls evaluation completed  Falls evaluation completed  Falls evaluation completed      Data saved with a previous flowsheet row definition    MEDICARE RISK AT HOME:  Medicare Risk at Home Any stairs in or around the home?: (Patient-Rptd) No If so, are there any without handrails?: (Patient-Rptd) No Home free of loose throw rugs in walkways, pet beds, electrical cords, etc?: (Patient-Rptd) Yes Adequate lighting in your home to reduce risk of falls?: (Patient-Rptd) Yes Life alert?: (Patient-Rptd) No Use of a cane, walker or w/c?: (Patient-Rptd) No Grab bars in the bathroom?: (Patient-Rptd) No Shower chair or bench in shower?: (Patient-Rptd) No Elevated toilet seat or a handicapped toilet?: (Patient-Rptd) Yes  TIMED UP AND GO:  Was the test performed?  No audio  Cognitive Function: 6CIT completed        03/09/2024    8:31 AM  6CIT Screen  What Year? 0 points  What month? 0 points  What time? 0 points  Count back from 20 0 points  Months in reverse 0 points  Repeat phrase 0 points  Total Score 0 points    Immunizations Immunization History  Administered Date(s) Administered   Influenza-Unspecified 03/29/2019, 03/14/2020, 04/24/2021  Moderna Sars-Covid-2 Vaccination 09/05/2019, 10/03/2019, 03/28/2020, 04/24/2021   PNEUMOCOCCAL CONJUGATE-20 03/04/2021   PPD Test 07/01/2014, 07/28/2015, 05/12/2016, 04/16/2020   Td 11/27/2015   Zoster Recombinant(Shingrix) 06/28/2013, 04/28/2014, 03/31/2019    Screening Tests Health Maintenance  Topic Date Due   Medicare Annual Wellness (AWV)  Never done    Influenza Vaccine  01/27/2024   DTaP/Tdap/Td (2 - Tdap) 11/26/2025   Colonoscopy  06/13/2027   Pneumococcal Vaccine: 50+ Years  Completed   Hepatitis C Screening  Completed   Zoster Vaccines- Shingrix  Completed   HPV VACCINES  Aged Out   Meningococcal B Vaccine  Aged Out   COVID-19 Vaccine  Discontinued    Health Maintenance Items Addressed: Will get flu vaccine at next OV  Additional Screening:  Vision Screening: Recommended annual ophthalmology exams for early detection of glaucoma and other disorders of the eye. Is the patient up to date with their annual eye exam?  Yes  Who is the provider or what is the name of the office in which the patient attends annual eye exams? [facility]  Dental Screening: Recommended annual dental exams for proper oral hygiene  Community Resource Referral / Chronic Care Management: CRR required this visit?  No   CCM required this visit?  No   Plan:    I have personally reviewed and noted the following in the patient's chart:   Medical and social history Use of alcohol, tobacco or illicit drugs  Current medications and supplements including opioid prescriptions. Patient is not currently taking opioid prescriptions. Functional ability and status Nutritional status Physical activity Advanced directives List of other physicians Hospitalizations, surgeries, and ER visits in previous 12 months Vitals Screenings to include cognitive, depression, and falls Referrals and appointments  In addition, I have reviewed and discussed with patient certain preventive protocols, quality metrics, and best practice recommendations. A written personalized care plan for preventive services as well as general preventive health recommendations were provided to patient.   Lolita Libra, CMA   03/09/2024   After Visit Summary: (MyChart) Due to this being a telephonic visit, the after visit summary with patients personalized plan was offered to patient  via MyChart   Notes: Nothing significant to report at this time.

## 2024-03-09 NOTE — Patient Instructions (Addendum)
 Mr. Logan Fox , Thank you for taking time out of your busy schedule to complete your Annual Wellness Visit with me. I enjoyed our conversation and look forward to speaking with you again next year. I, as well as your care team,  appreciate your ongoing commitment to your health goals. Please review the following plan we discussed and let me know if I can assist you in the future. Your Game plan/ To Do List     Follow up Visits: Next Medicare AWV with our clinical staff: 03/15/25 8:20am  Next Office Visit with your provider: 03/23/24 7:45am, Dr Frann. You may get your flu vaccine at this visit or your regular pharmacy.  Clinician Recommendations:  Aim for 30 minutes of exercise or brisk walking, 6-8 glasses of water, and 5 servings of fruits and vegetables each day.       This is a list of the screening recommended for you and due dates:  Health Maintenance  Topic Date Due   Medicare Annual Wellness Visit  Never done   Flu Shot  01/27/2024   DTaP/Tdap/Td vaccine (2 - Tdap) 11/26/2025   Colon Cancer Screening  06/13/2027   Pneumococcal Vaccine for age over 82  Completed   Hepatitis C Screening  Completed   Zoster (Shingles) Vaccine  Completed   HPV Vaccine  Aged Out   Meningitis B Vaccine  Aged Out   COVID-19 Vaccine  Discontinued    Advanced directives: (In Chart) A copy of your advanced directives are scanned into your chart should your provider ever need it. Advance Care Planning is important because it:  [x]  Makes sure you receive the medical care that is consistent with your values, goals, and preferences  [x]  It provides guidance to your family and loved ones and reduces their decisional burden about whether or not they are making the right decisions based on your wishes.  Follow the link provided in your after visit summary or read over the paperwork we have mailed to you to help you started getting your Advance Directives in place. If you need assistance in completing these,  please reach out to us  so that we can help you!  See attachments for Preventive Care and Fall Prevention Tips.

## 2024-03-23 ENCOUNTER — Encounter: Payer: Self-pay | Admitting: Family Medicine

## 2024-03-23 ENCOUNTER — Ambulatory Visit (INDEPENDENT_AMBULATORY_CARE_PROVIDER_SITE_OTHER): Admitting: Family Medicine

## 2024-03-23 ENCOUNTER — Ambulatory Visit: Payer: Self-pay | Admitting: Family Medicine

## 2024-03-23 VITALS — BP 134/84 | HR 60 | Temp 98.0°F | Resp 18 | Ht 74.0 in | Wt 230.6 lb

## 2024-03-23 DIAGNOSIS — I25118 Atherosclerotic heart disease of native coronary artery with other forms of angina pectoris: Secondary | ICD-10-CM

## 2024-03-23 DIAGNOSIS — M1A9XX0 Chronic gout, unspecified, without tophus (tophi): Secondary | ICD-10-CM | POA: Diagnosis not present

## 2024-03-23 DIAGNOSIS — I10 Essential (primary) hypertension: Secondary | ICD-10-CM | POA: Diagnosis not present

## 2024-03-23 DIAGNOSIS — Z0000 Encounter for general adult medical examination without abnormal findings: Secondary | ICD-10-CM | POA: Diagnosis not present

## 2024-03-23 LAB — CBC
HCT: 48.3 % (ref 39.0–52.0)
Hemoglobin: 16.5 g/dL (ref 13.0–17.0)
MCHC: 34.1 g/dL (ref 30.0–36.0)
MCV: 95.4 fl (ref 78.0–100.0)
Platelets: 193 K/uL (ref 150.0–400.0)
RBC: 5.06 Mil/uL (ref 4.22–5.81)
RDW: 13.1 % (ref 11.5–15.5)
WBC: 5.7 K/uL (ref 4.0–10.5)

## 2024-03-23 LAB — URIC ACID: Uric Acid, Serum: 7.3 mg/dL (ref 4.0–7.8)

## 2024-03-23 LAB — LIPID PANEL
Cholesterol: 142 mg/dL (ref 0–200)
HDL: 47.6 mg/dL (ref >39.00)
LDL Cholesterol: 73 mg/dL (ref 0–99)
NonHDL: 94.66
Total CHOL/HDL Ratio: 3
Triglycerides: 107 mg/dL (ref 0.0–149.0)
VLDL: 21.4 mg/dL (ref 0.0–40.0)

## 2024-03-23 LAB — COMPREHENSIVE METABOLIC PANEL WITH GFR
ALT: 21 U/L (ref 0–53)
AST: 18 U/L (ref 0–37)
Albumin: 4.7 g/dL (ref 3.5–5.2)
Alkaline Phosphatase: 53 U/L (ref 39–117)
BUN: 19 mg/dL (ref 6–23)
CO2: 32 meq/L (ref 19–32)
Calcium: 10 mg/dL (ref 8.4–10.5)
Chloride: 102 meq/L (ref 96–112)
Creatinine, Ser: 1.1 mg/dL (ref 0.40–1.50)
GFR: 68.87 mL/min (ref >60.00)
Glucose, Bld: 106 mg/dL — ABNORMAL HIGH (ref 70–99)
Potassium: 4.6 meq/L (ref 3.5–5.1)
Sodium: 139 meq/L (ref 135–145)
Total Bilirubin: 1 mg/dL (ref 0.2–1.2)
Total Protein: 7 g/dL (ref 6.0–8.3)

## 2024-03-23 NOTE — Progress Notes (Signed)
 Chief Complaint  Patient presents with   Annual Exam    [name] is here for a complete physical.   His last physical was >1 year ago.  Current diet: in general, a healthy diet.   Current exercise: lifting wts, cycling Weight trend: stable Fatigue out of ordinary? No. Seat belt? Yes.   Advanced directive? Yes  Health maintenance Shingrix- Yes Colonoscopy- Yes Tetanus- Yes Hep C- Yes Pneumonia vaccine- Yes  Past Medical History:  Diagnosis Date   Heart disease    Hypertension      Past Surgical History:  Procedure Laterality Date   arthoscopic knee surgery     COLONOSCOPY     > 10 yrs.  It was normal    Medications  Current Outpatient Medications on File Prior to Visit  Medication Sig Dispense Refill   aspirin EC 81 MG tablet Take 81 mg by mouth daily.     carvedilol  (COREG ) 3.125 MG tablet TAKE 1 TABLET BY MOUTH TWICE DAILY WITH A MEAL 180 tablet 0   Colchicine  (MITIGARE ) 0.6 MG CAPS 2 caps now, 1 cap 1 hour later.  If symptoms persist, you may repeat the following day. 30 capsule 1   lisinopril  (ZESTRIL ) 20 MG tablet Take 1 tablet by mouth once daily 90 tablet 0   rosuvastatin  (CRESTOR ) 5 MG tablet Take 1 tablet by mouth once daily 90 tablet 0    Allergies No Known Allergies  Family History Family History  Problem Relation Age of Onset   Arthritis Mother    Cancer Mother    Diabetes Mother    Heart disease Mother    Hypertension Mother    Colon cancer Mother 42   Alcohol abuse Father    Arthritis Father    Diabetes Father    Heart disease Father    Hypertension Father    Cancer Sister    Diabetes Sister    Hypertension Sister    Kidney disease Sister    Diabetes Brother    Hyperlipidemia Brother    Hypertension Brother    Colon polyps Neg Hx    Rectal cancer Neg Hx    Stomach cancer Neg Hx    Esophageal cancer Neg Hx     Review of Systems: Constitutional:  no fevers Eye:  no recent significant change in vision Ears:  No  changes in hearing Nose/Mouth/Throat:  no complaints of nasal congestion, no sore throat Cardiovascular: no exertional chest pain Respiratory:  No shortness of breath Gastrointestinal:  No change in bowel habits GU:  No frequency Integumentary:  no abnormal skin lesions reported Neurologic:  no headaches Endocrine:  denies unexplained weight changes  Exam BP 134/84 (BP Location: Left Arm, Cuff Size: Normal)   Pulse 60   Temp 98 F (36.7 C)   Resp 18   Ht 6' 2 (1.88 m)   Wt 230 lb 9.6 oz (104.6 kg)   SpO2 98%   BMI 29.61 kg/m  General:  well developed, well nourished, in no apparent distress Skin:  no significant moles, warts, or growths Head:  no masses, lesions, or tenderness Eyes:  pupils equal and round, sclera anicteric without injection Ears:  canals without lesions, TMs shiny without retraction, no obvious effusion, no erythema Nose:  nares patent, mucosa normal Throat/Pharynx:  lips and gingiva without lesion; tongue and uvula midline; non-inflamed pharynx; no exudates or postnasal drainage Lungs:  clear to auscultation, breath sounds equal bilaterally, no respiratory distress Cardio:  regular rate and rhythm,  no LE edema or bruits Rectal: Deferred GI: BS+, S, NT, ND, no masses or organomegaly Musculoskeletal:  symmetrical muscle groups noted without atrophy or deformity Neuro:  gait normal; deep tendon reflexes normal and symmetric Psych: well oriented with normal range of affect and appropriate judgment/insight  Assessment and Plan  Well adult exam  Essential hypertension - Plan: CBC, Comprehensive metabolic panel with GFR, Lipid panel, EKG 12-Lead  Chronic gout without tophus, unspecified cause, unspecified site - Plan: Uric acid  Coronary artery disease of native heart with stable angina pectoris, unspecified vessel or lesion type - Plan: Ambulatory referral to Cardiology, EKG 12-Lead   Well 68 y.o. male. Counseled on diet and exercise. Other orders as  above. Will get him back in with Dr. JINNY of the cardiology team who put his stents in.  EKG shows sinus bradycardia.  No interval abnormalities.  Normal axis.  Good R wave progression.  No T wave inversions or signs of ischemia. Follow up in 6 mo.  The patient voiced understanding and agreement to the plan.  Logan Fox Mt [place], DO 03/23/24 8:36 AM

## 2024-03-23 NOTE — Patient Instructions (Addendum)
 Give us  2-3 business days to get the results of your labs back.   Keep the diet clean and stay active.  If you do not hear anything about your referral in the next 1-2 weeks, call our office and ask for an update.  Foods that may reduce pain: 1) Ginger 2) Blueberries 3) Salmon 4) Pumpkin seeds 5) Dark chocolate 6) Turmeric 7) Tart cherries 8) Virgin olive oil 9) Chili peppers 10) Mint 11) Krill oil  Let us  know if you need anything.

## 2024-03-28 ENCOUNTER — Encounter: Payer: Self-pay | Admitting: Family Medicine

## 2024-04-10 DIAGNOSIS — I2510 Atherosclerotic heart disease of native coronary artery without angina pectoris: Secondary | ICD-10-CM | POA: Diagnosis not present

## 2024-04-10 DIAGNOSIS — Z136 Encounter for screening for cardiovascular disorders: Secondary | ICD-10-CM | POA: Diagnosis not present

## 2024-04-11 DIAGNOSIS — R001 Bradycardia, unspecified: Secondary | ICD-10-CM | POA: Diagnosis not present

## 2024-04-26 ENCOUNTER — Other Ambulatory Visit: Payer: Self-pay | Admitting: Medical Genetics

## 2024-04-26 DIAGNOSIS — Z006 Encounter for examination for normal comparison and control in clinical research program: Secondary | ICD-10-CM

## 2024-05-04 DIAGNOSIS — R079 Chest pain, unspecified: Secondary | ICD-10-CM | POA: Diagnosis not present

## 2024-05-04 DIAGNOSIS — I2510 Atherosclerotic heart disease of native coronary artery without angina pectoris: Secondary | ICD-10-CM | POA: Diagnosis not present

## 2024-05-16 DIAGNOSIS — R0789 Other chest pain: Secondary | ICD-10-CM | POA: Diagnosis not present

## 2024-05-16 DIAGNOSIS — R0602 Shortness of breath: Secondary | ICD-10-CM | POA: Diagnosis not present

## 2024-05-16 DIAGNOSIS — I10 Essential (primary) hypertension: Secondary | ICD-10-CM | POA: Diagnosis not present

## 2024-05-16 DIAGNOSIS — I2510 Atherosclerotic heart disease of native coronary artery without angina pectoris: Secondary | ICD-10-CM | POA: Diagnosis not present

## 2024-05-16 DIAGNOSIS — E785 Hyperlipidemia, unspecified: Secondary | ICD-10-CM | POA: Diagnosis not present

## 2024-05-16 DIAGNOSIS — Z955 Presence of coronary angioplasty implant and graft: Secondary | ICD-10-CM | POA: Diagnosis not present

## 2024-05-21 DIAGNOSIS — I2510 Atherosclerotic heart disease of native coronary artery without angina pectoris: Secondary | ICD-10-CM | POA: Diagnosis not present

## 2024-05-21 DIAGNOSIS — Y718 Miscellaneous cardiovascular devices associated with adverse incidents, not elsewhere classified: Secondary | ICD-10-CM | POA: Diagnosis not present

## 2024-05-21 DIAGNOSIS — Z7982 Long term (current) use of aspirin: Secondary | ICD-10-CM | POA: Diagnosis not present

## 2024-05-21 DIAGNOSIS — T82855A Stenosis of coronary artery stent, initial encounter: Secondary | ICD-10-CM | POA: Diagnosis not present

## 2024-05-21 DIAGNOSIS — I25119 Atherosclerotic heart disease of native coronary artery with unspecified angina pectoris: Secondary | ICD-10-CM | POA: Diagnosis not present

## 2024-05-21 DIAGNOSIS — Y929 Unspecified place or not applicable: Secondary | ICD-10-CM | POA: Diagnosis not present

## 2024-05-21 DIAGNOSIS — R001 Bradycardia, unspecified: Secondary | ICD-10-CM | POA: Diagnosis not present

## 2024-05-21 DIAGNOSIS — R9439 Abnormal result of other cardiovascular function study: Secondary | ICD-10-CM | POA: Diagnosis not present

## 2024-05-21 DIAGNOSIS — R0602 Shortness of breath: Secondary | ICD-10-CM | POA: Diagnosis not present

## 2024-05-21 LAB — GENECONNECT MOLECULAR SCREEN: Genetic Analysis Overall Interpretation: NEGATIVE

## 2024-05-22 ENCOUNTER — Other Ambulatory Visit: Payer: Self-pay | Admitting: Family Medicine

## 2024-05-22 DIAGNOSIS — I25118 Atherosclerotic heart disease of native coronary artery with other forms of angina pectoris: Secondary | ICD-10-CM

## 2024-05-22 DIAGNOSIS — I10 Essential (primary) hypertension: Secondary | ICD-10-CM

## 2024-09-21 ENCOUNTER — Ambulatory Visit: Admitting: Family Medicine

## 2024-09-27 ENCOUNTER — Ambulatory Visit: Admitting: Family Medicine

## 2025-03-15 ENCOUNTER — Ambulatory Visit
